# Patient Record
Sex: Female | Born: 1960 | Race: White | Hispanic: No | Marital: Married
Health system: Midwestern US, Academic
[De-identification: ages and names within clinical notes are randomized; demographics above are authoritative.]

---

## 2019-04-01 IMAGING — MG MAMMOGRAM, DIGITAL SCREEN BILA
1 series · 4 of 4 positions shown · non-contrast
Comparison: none

[Series 2: R CC · right · 4 of 4 slices shown]
[im 1/4]
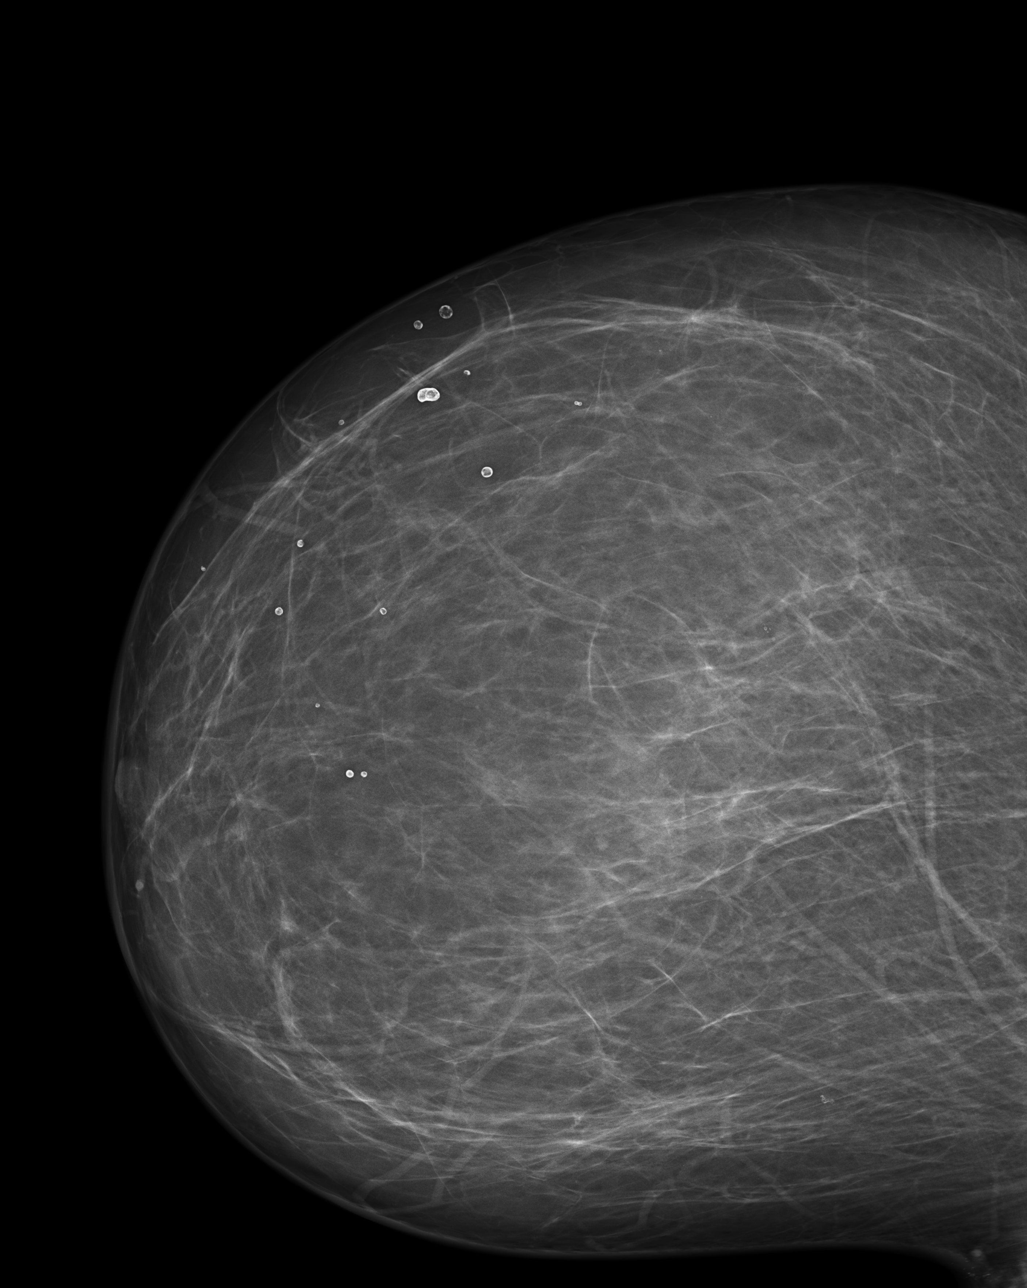
[im 2/4]
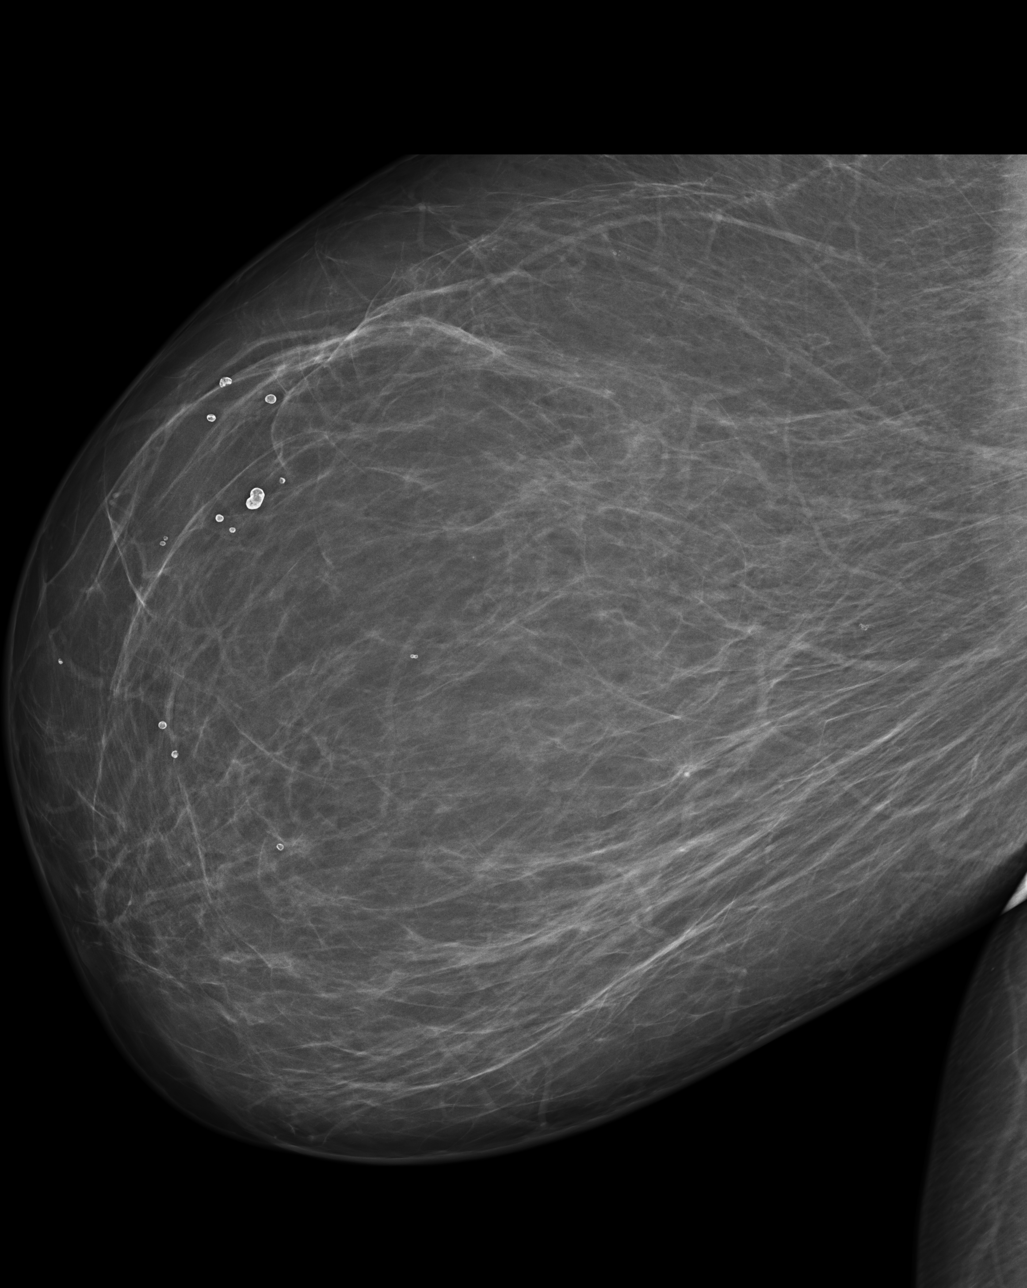
[im 3/4]
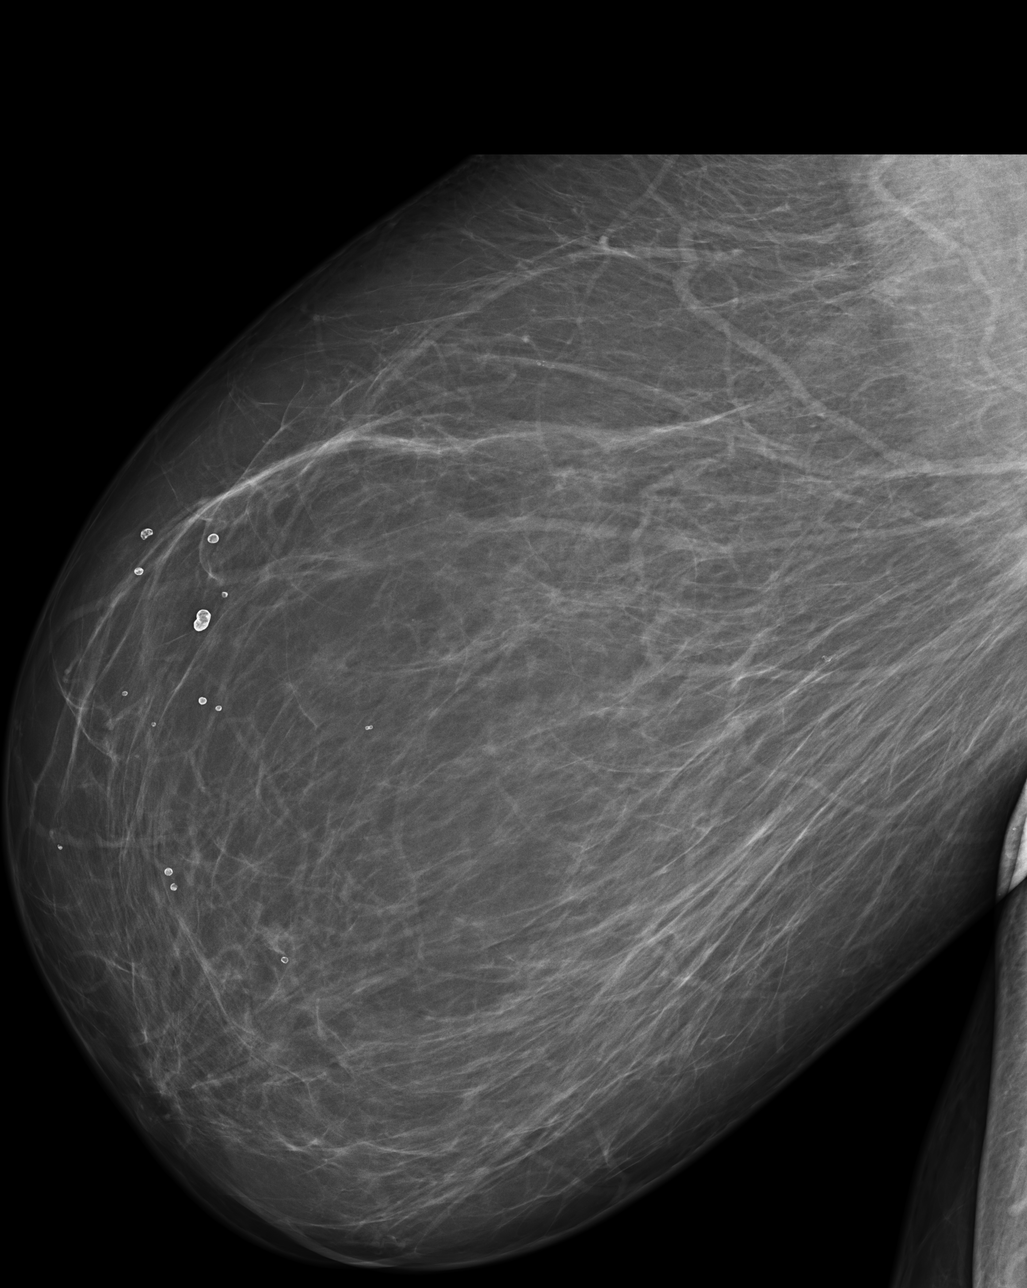
[im 4/4]
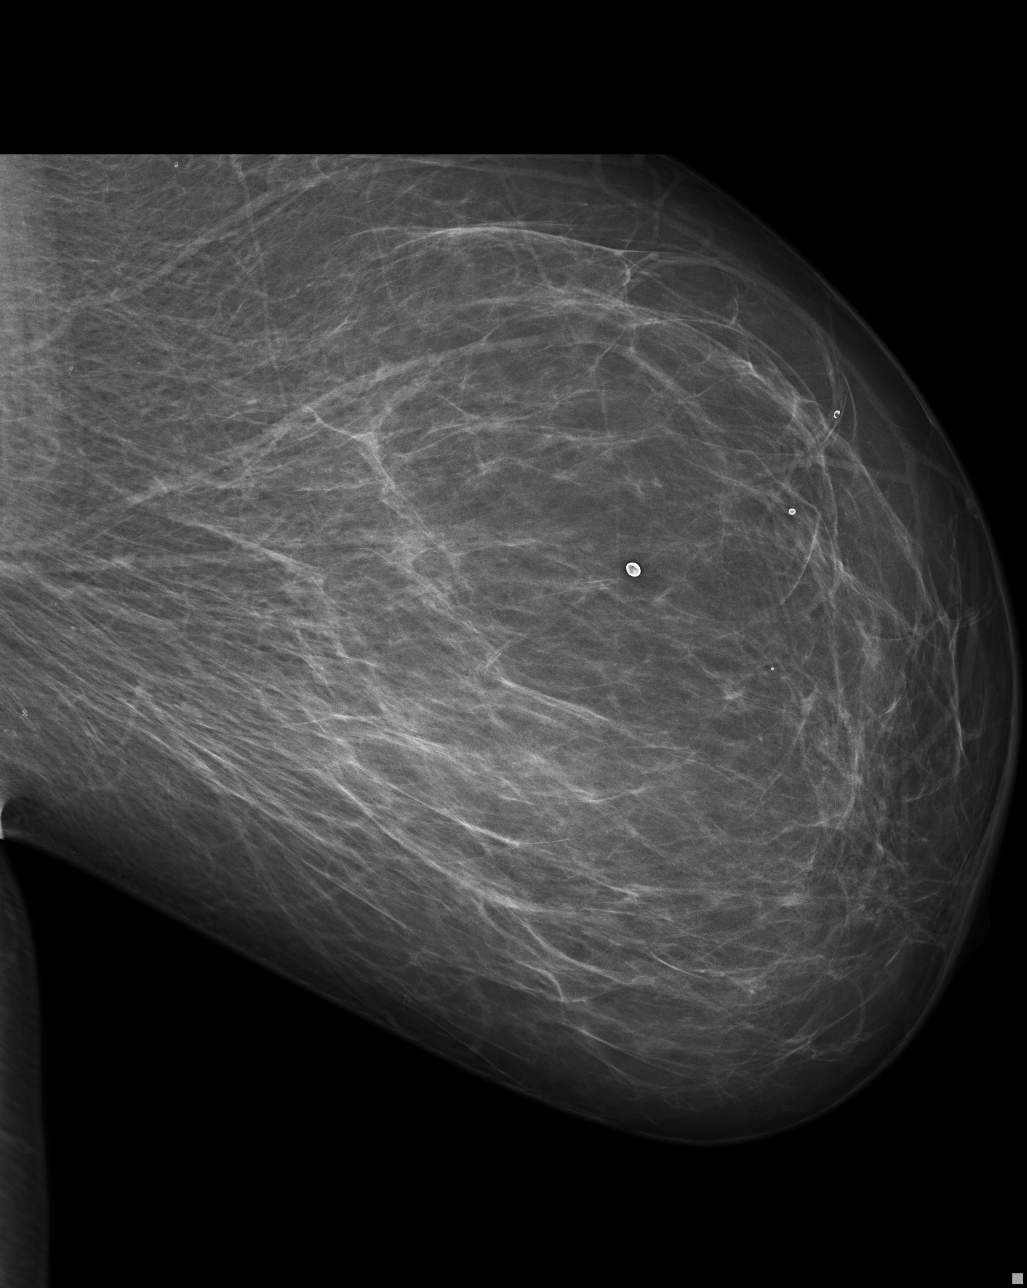

[4 of 4 positions shown; findings below may reference images not displayed]

EXAM
SCREENING MAMMOGRAM, BILATERAL

INDICATION
screening
BASELINE MAMMO TODAY. SCREENING. AB (2D) PRIORS: NONE.

TECHNIQUE
Bilateral craniocaudal and medial lateral oblique views were generated and reviewed with computer-
aided detection.

COMPARISONS
Baseline exam.

FINDINGS
ACR Type 2: 25-50% There are scattered fibroglandular densities.
No concerning masses or calcifications are seen.

IMPRESSION
No mammographic evidence malignancy. One year follow-up is recommended.
BI-RADS 2, BENIGN.

Tech Notes:

## 2020-01-15 IMAGING — CT BRAIN WO(Adult)
3 of 4 series · 14 of 47 positions shown, 16 images · non-contrast
Comparison: none

[Series 4: brain cor 5.00 hr40 s3 · coronal · 0.30mm/px · 3 of 41 slices shown]
[im 14/41  brain]
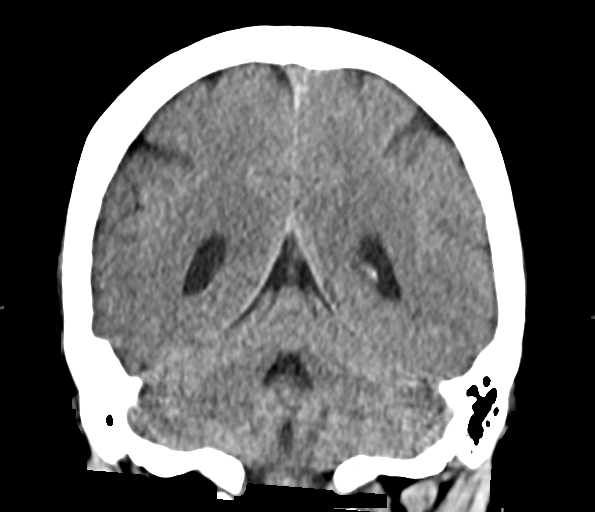
[im 18/41  brain]
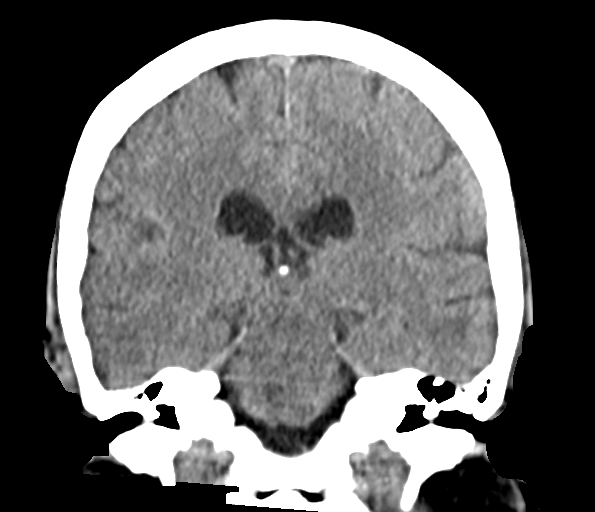
[im 23/41  brain]
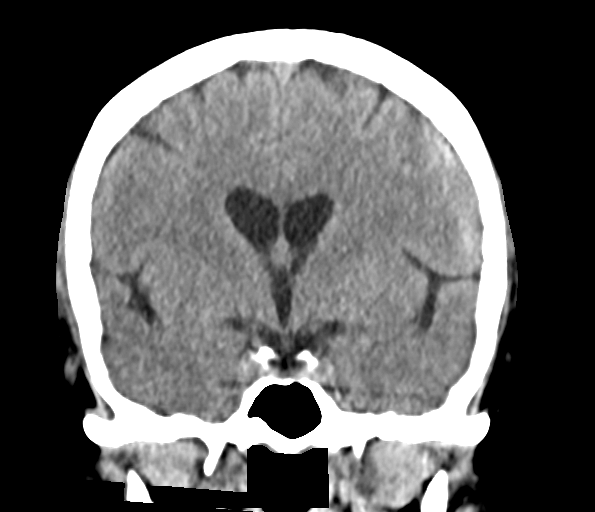

[Series 6: brain sag 5.00 hr40 s3 · sagittal · 0.30mm/px · 3 of 35 slices shown]
[im 12/35  brain]
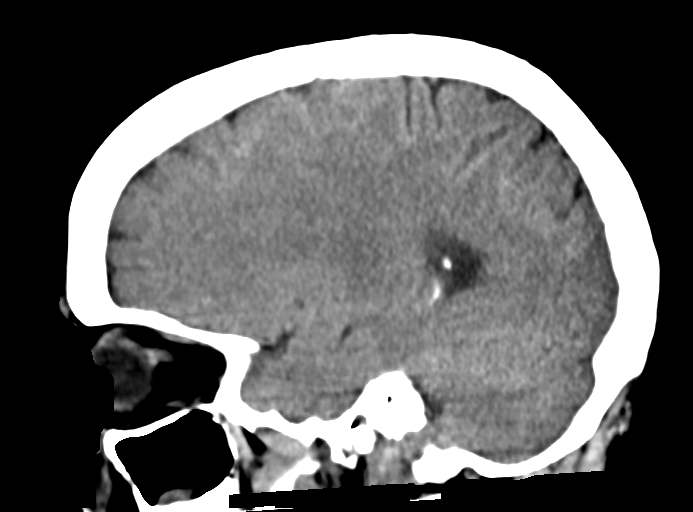
[im 18/35  brain]
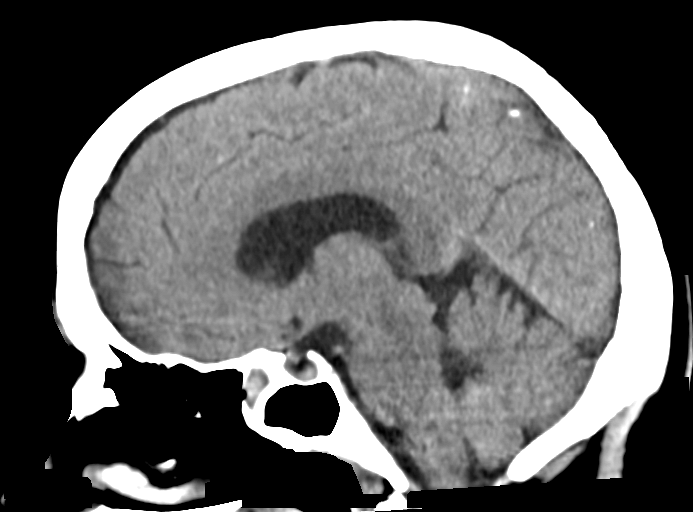
[im 23/35  brain]
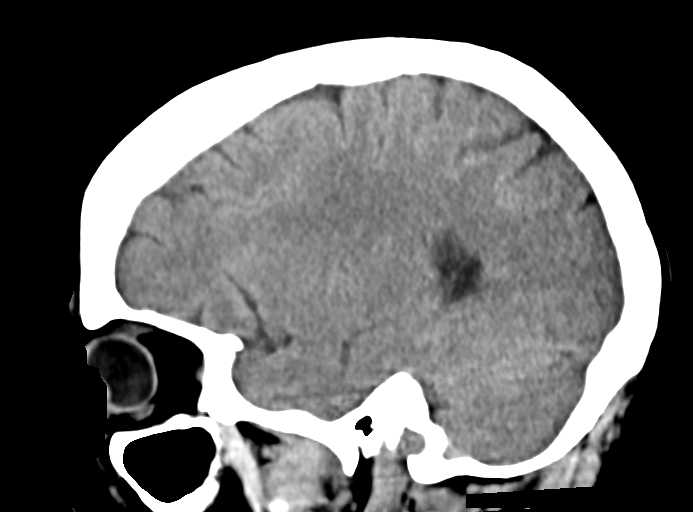

[Series 8: brain ax 2.00 hr60 s3 · axial · 0.35mm/px · z∈[-520,-401]mm · 8 of 76 slices shown, 10 images]
[im 8/76  brain]
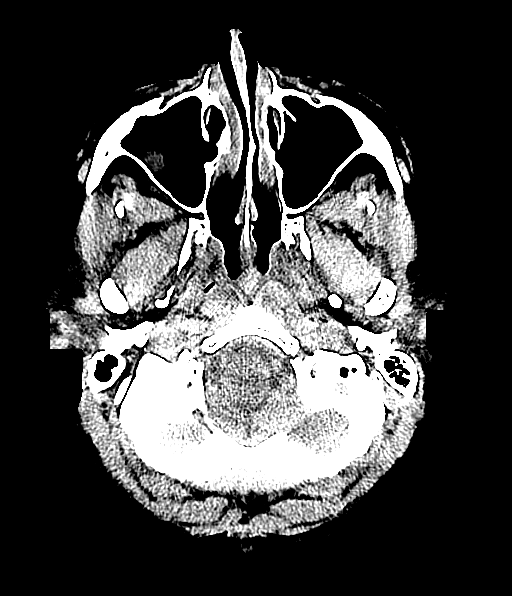
[im 8/76  bone]
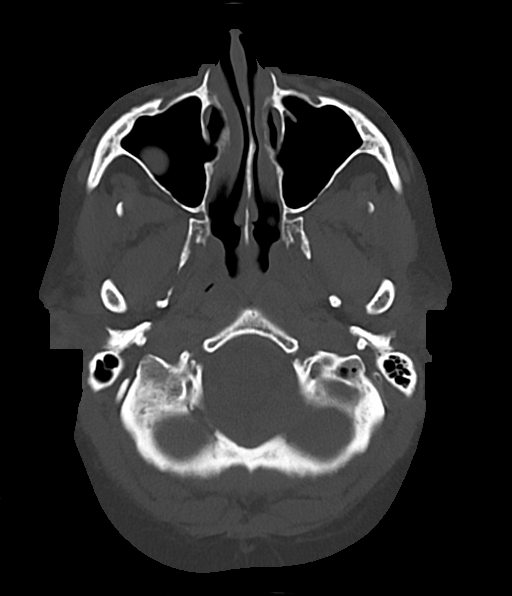
[im 15/76  brain]
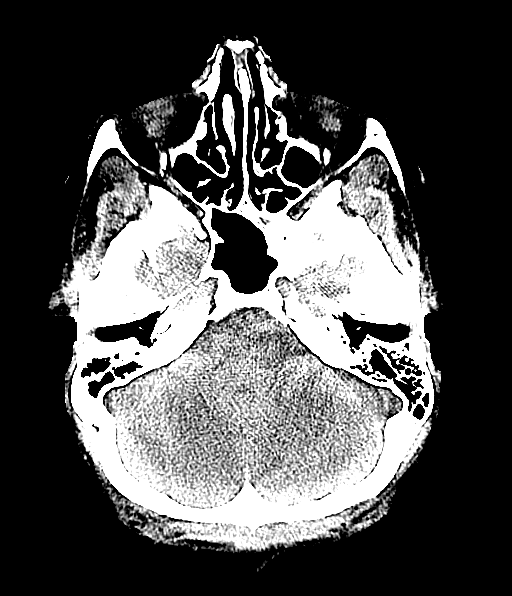
[im 26/76  brain]
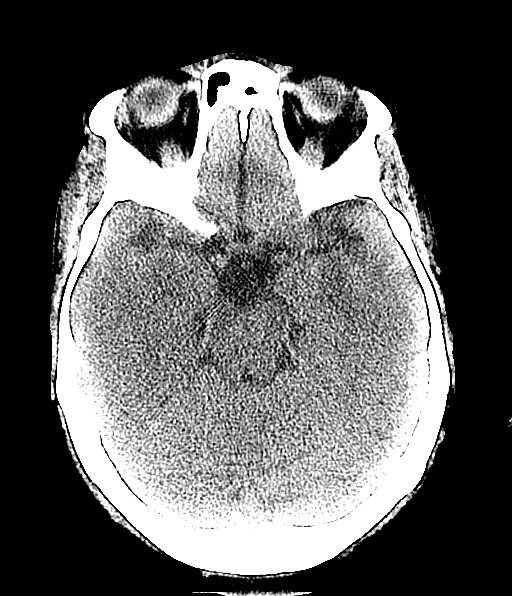
[im 33/76  brain]
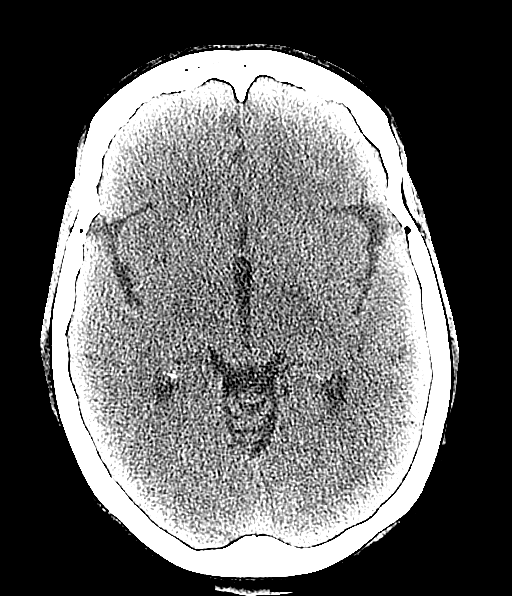
[im 43/76  brain]
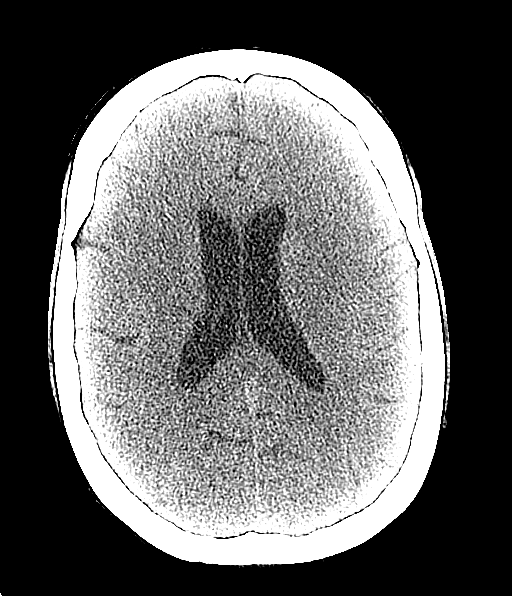
[im 43/76  bone]
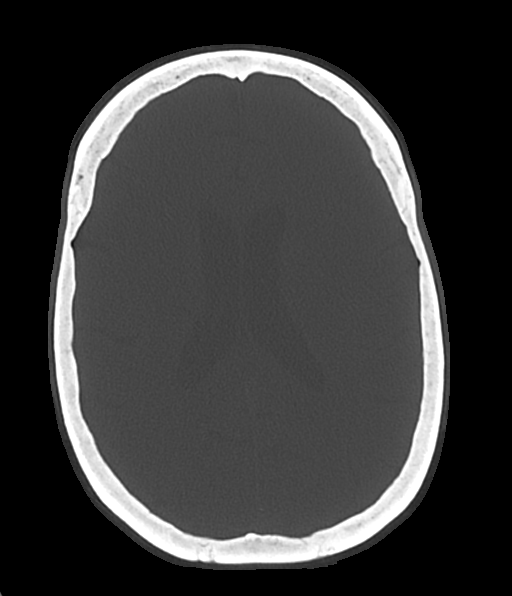
[im 51/76  brain]
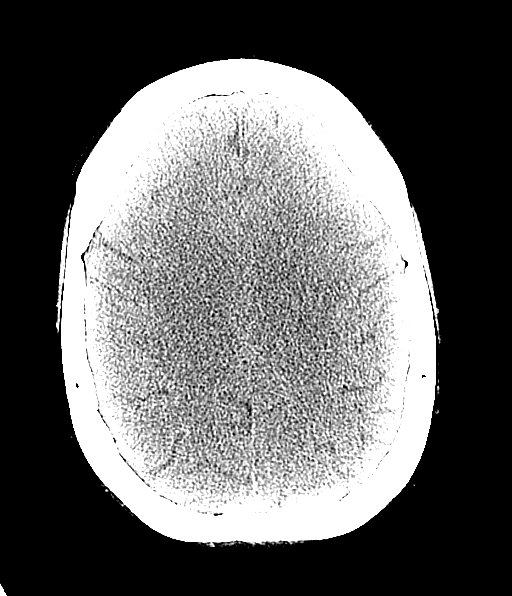
[im 61/76  brain]
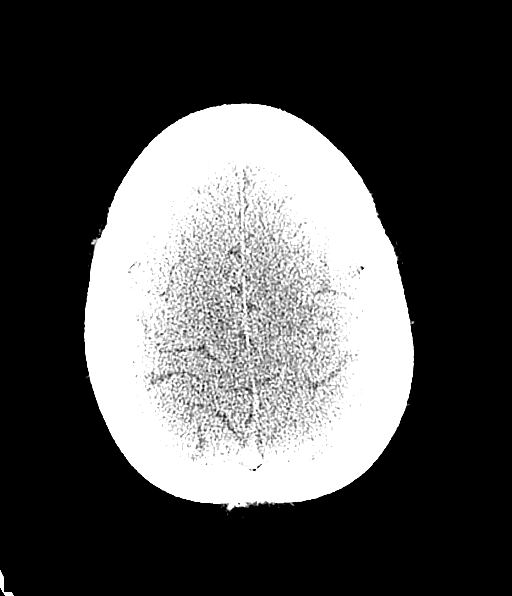
[im 68/76  brain]
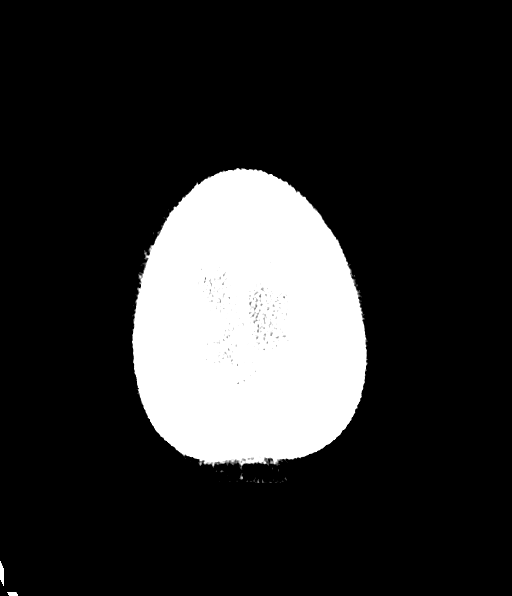

[14 of 47 positions shown; findings below may reference images not displayed]

DIAGNOSTIC STUDIES

EXAM

CT head/brain wo con

INDICATION

increasing weakness, can't walk
Patient reports weakness and difficulty walking since starting new medication yesterday.  BG

TECHNIQUE

All CT scans at this facility use dose modulation, iterative reconstruction, and/or weight based
dosing when appropriate to reduce radiation dose to as low as reasonably achievable.

Number of previous computed tomography exams in the last 12 months is 0  .

Number of previous nuclear medicine myocardial perfusion studies in the last 12 months is 0  .

COMPARISONS

None available.

FINDINGS

No intracranial hemorrhage. No mass effect or cerebral herniation. The ventricles are normal in
size and configuration. Calvarium is intact. Right maxillary sinus polyp or mucosal retention cyst.
Mastoid air cells are clear.

IMPRESSION

No acute intracranial findings.

Tech Notes:

Patient reports weakness and difficulty walking since starting new medication yesterday.
BG

## 2020-01-16 IMAGING — MR T-spine^Routine
7 of 8 series · 41 of 48 positions shown · non-contrast
Comparison: none

[Series 3: T2 · sagittal · 3.0mm · 0.62mm/px · 5 of 15 slices shown (1 of 3)]
[im 1/15]
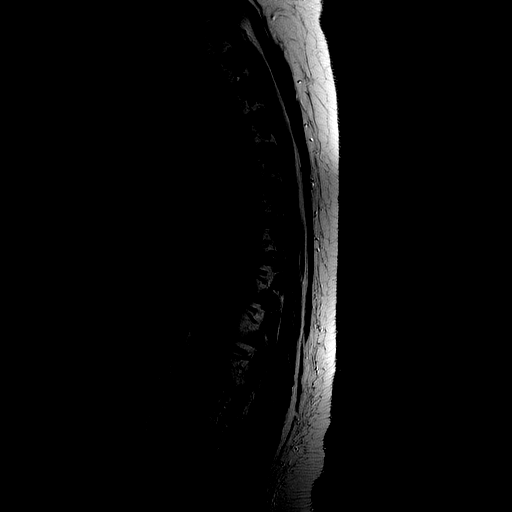
[im 4/15]
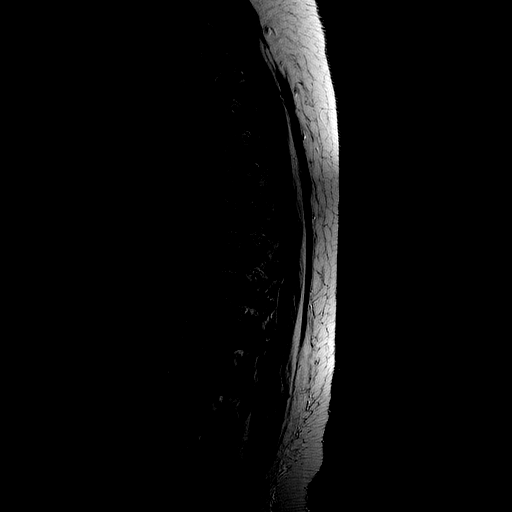
[im 8/15]
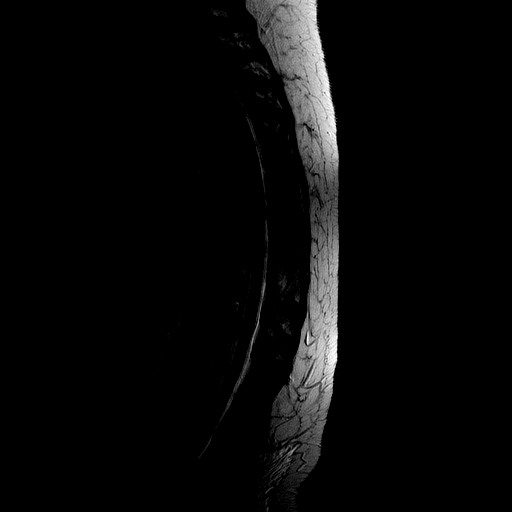
[im 11/15]
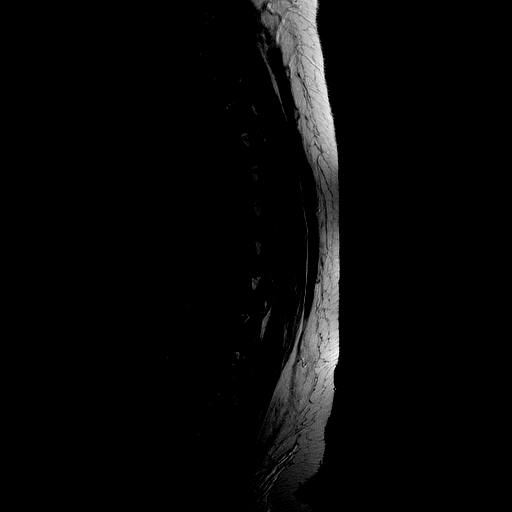
[im 15/15]
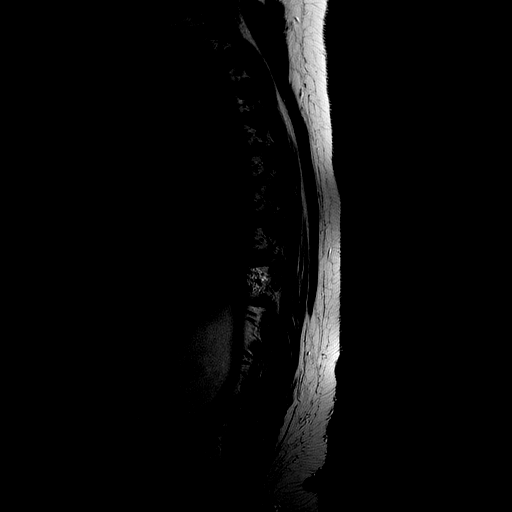

[Series 4: T1 · sagittal · 3.0mm · 0.71mm/px · 6 of 16 slices shown (1 of 3)]
[im 1/16]
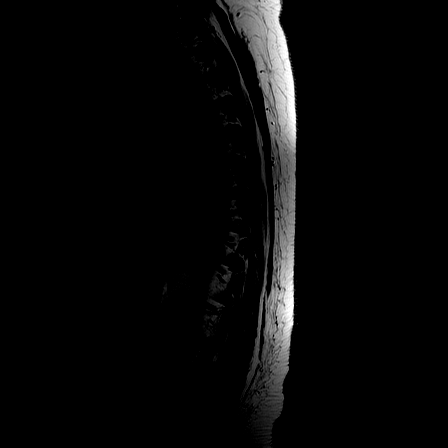
[im 4/16]
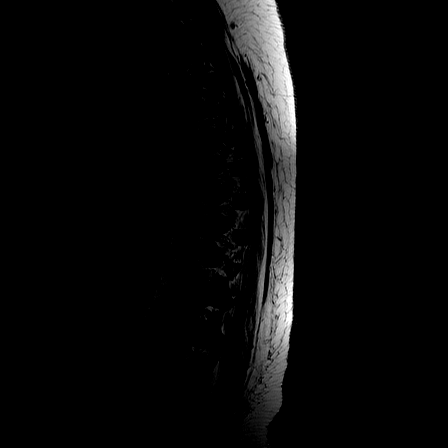
[im 7/16]
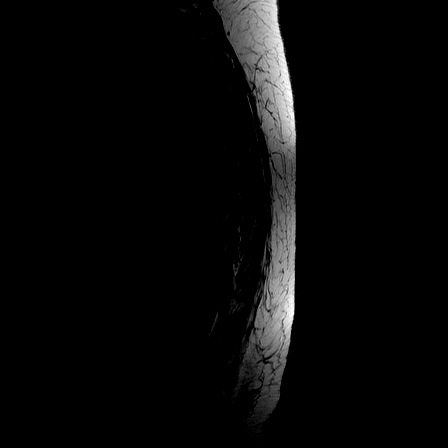
[im 10/16]
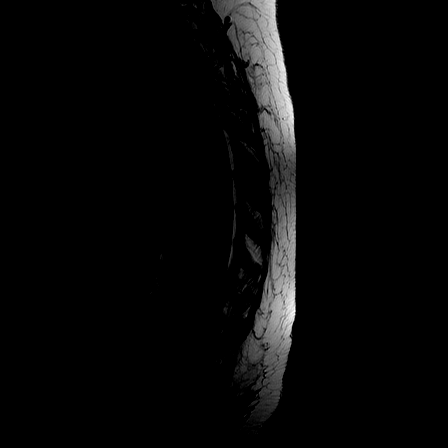
[im 13/16]
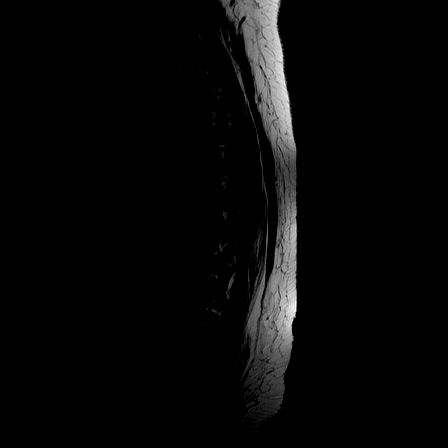
[im 16/16]
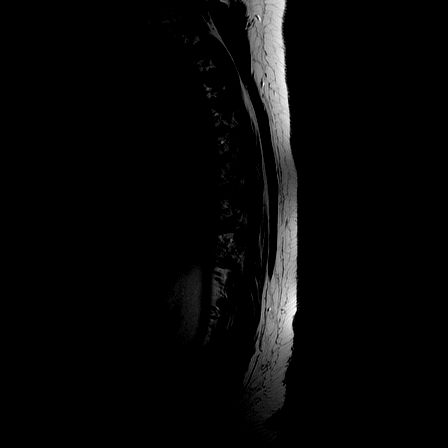

[Series 5: STIR · sagittal · 3.0mm · 0.62mm/px · 2 of 14 slices shown]
[im 1/14]
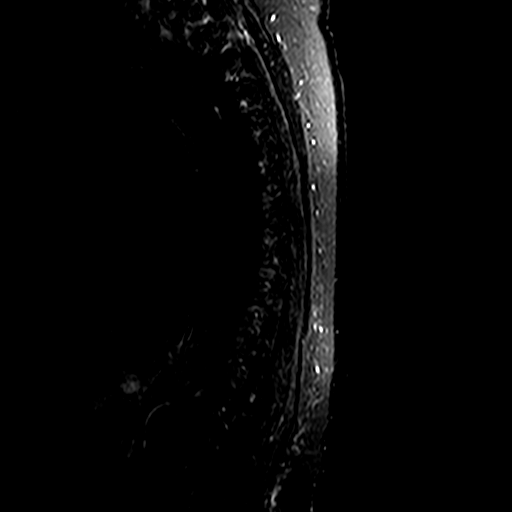
[im 4/14]
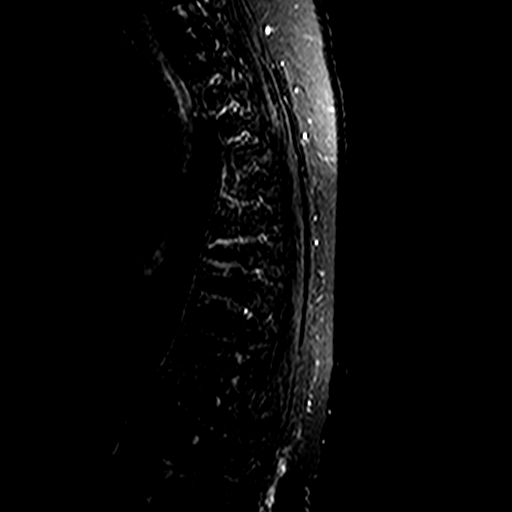

[Series 6: T2 · axial · 5.0mm · 0.62mm/px · z∈[-24,+114]mm · 7 of 20 slices shown (2 of 3)]
[im 1/20]
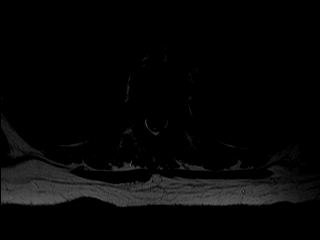
[im 4/20]
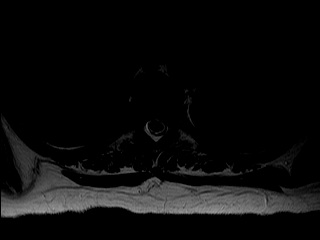
[im 7/20]
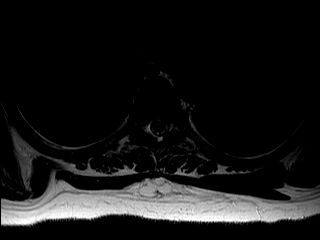
[im 10/20]
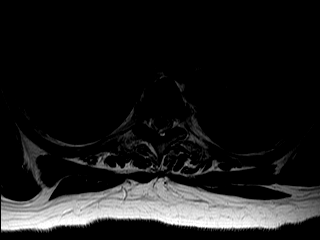
[im 13/20]
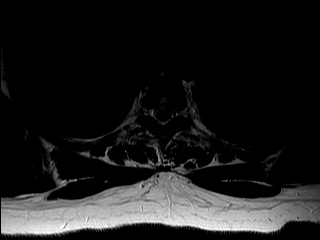
[im 16/20]
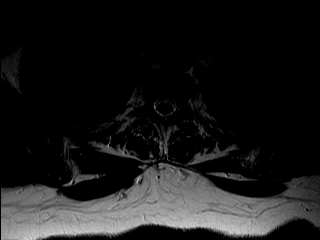
[im 20/20]
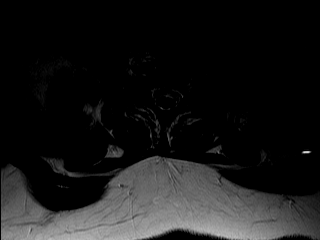

[Series 7: T1 · axial · 5.0mm · 0.78mm/px · z∈[-24,+114]mm · 7 of 19 slices shown (2 of 3)]
[im 1/19]
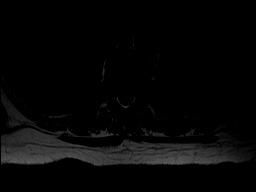
[im 4/19]
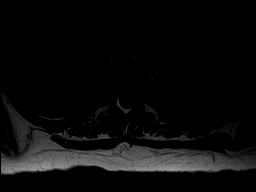
[im 7/19]
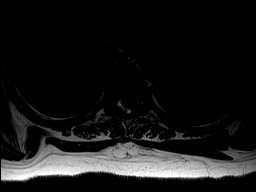
[im 10/19]
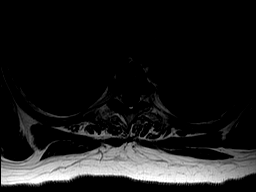
[im 13/19]
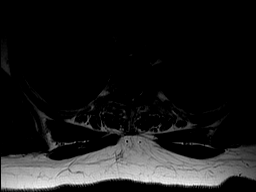
[im 16/19]
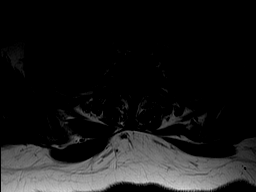
[im 19/19]
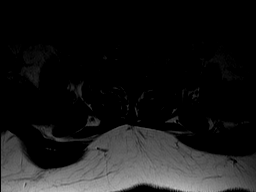

[Series 8: T2 · axial · 5.0mm · 0.62mm/px · z∈[-109,+29]mm · 7 of 20 slices shown (3 of 3)]
[im 1/20]
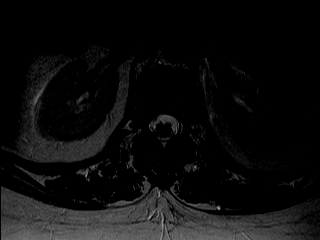
[im 4/20]
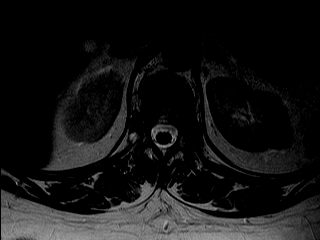
[im 7/20]
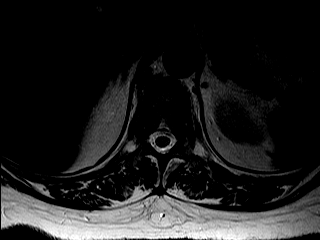
[im 10/20]
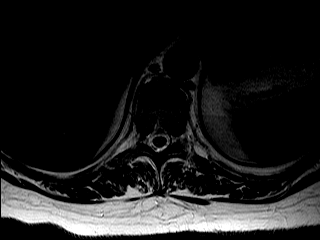
[im 13/20]
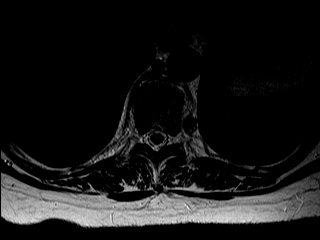
[im 16/20]
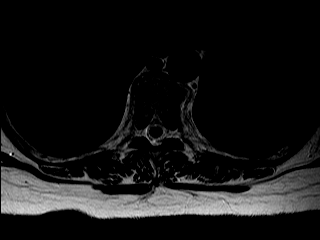
[im 20/20]
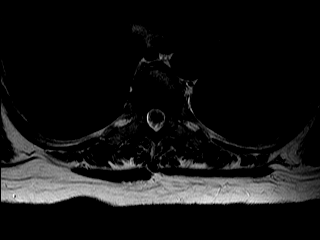

[Series 9: T1 · axial · 5.0mm · 0.78mm/px · z∈[-109,+29]mm · 7 of 20 slices shown (3 of 3)]
[im 1/20]
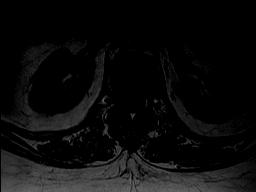
[im 4/20]
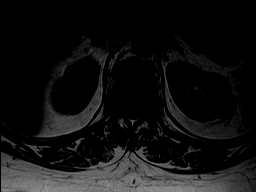
[im 7/20]
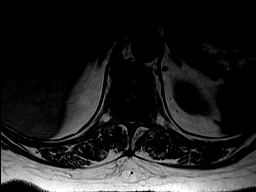
[im 10/20]
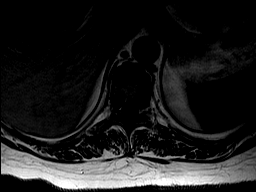
[im 13/20]
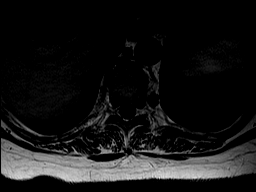
[im 16/20]
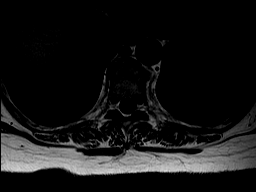
[im 20/20]
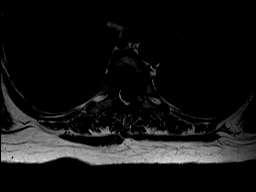

[41 of 48 positions shown; findings below may reference images not displayed]

EXAM

MR thoracic spine

INDICATION

left leg numb, both legs weak and stiff
History of parkinsons, weakness of lower extremity with inability to move.  New onset last few days
after starting new medications.

FINDINGS

There is no wedging or compression. There is no subluxation. No marrow edema is identified.

There is diffuse mid and lower thoracic degenerative changes with disc bulging. No dominant
herniation is identified. There is no canal or neural foraminal stenosis

IMPRESSION

There is no compression or subluxation

There diffuse degenerative changes in the mid and lower thoracic spine without stenosis of the
canal or neural foramen.

Tech Notes:

History of parkinsons, weakness of lower extremity with inability to move.  New onset last few days
after starting new medications.

## 2020-01-16 IMAGING — MR L-spine^LUMBAR BLOCK
5 series · 34 of 48 positions shown · non-contrast
Comparison: none

[Series 2: T2 · sagittal · 4.0mm · 0.62mm/px · 6 of 13 slices shown (1 of 2)]
[im 1/13]
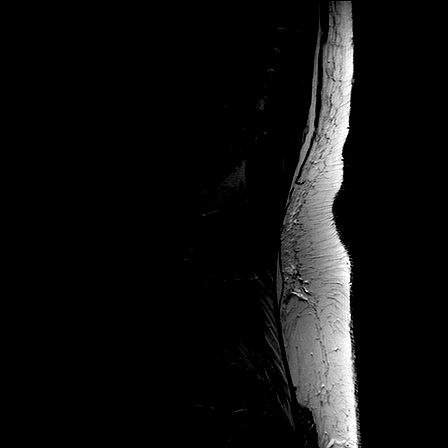
[im 3/13]
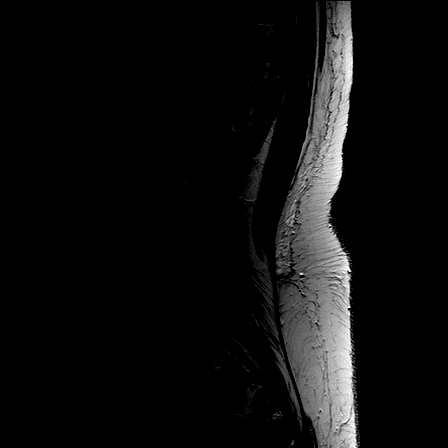
[im 5/13]
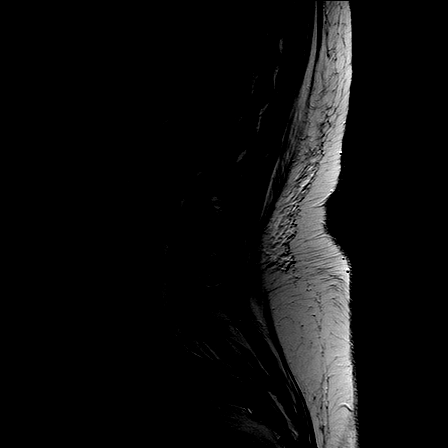
[im 8/13]
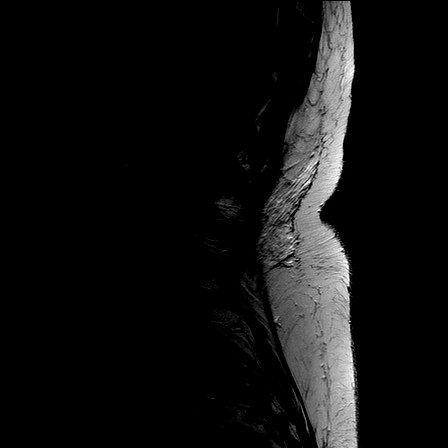
[im 10/13]
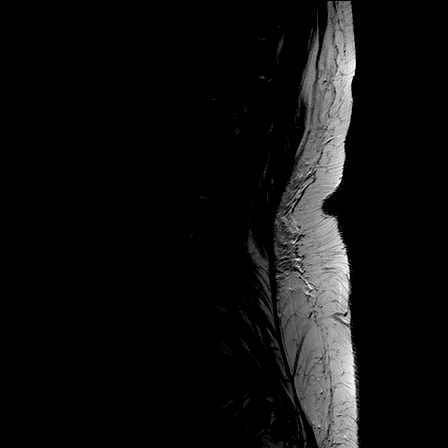
[im 13/13]
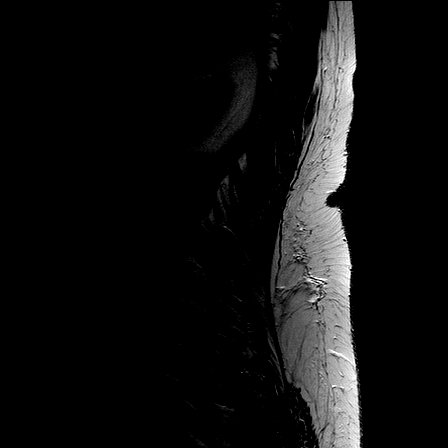

[Series 3: T1 · sagittal · 4.0mm · 0.73mm/px · 6 of 13 slices shown (1 of 2)]
[im 1/13]
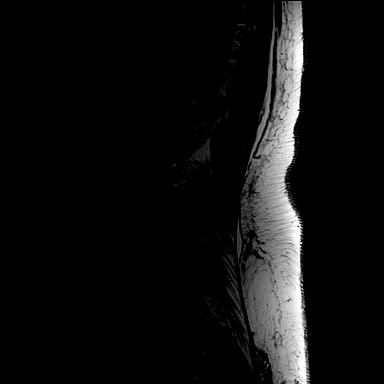
[im 3/13]
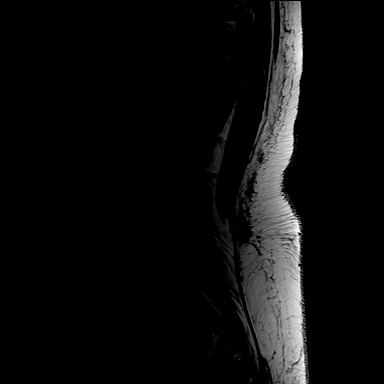
[im 5/13]
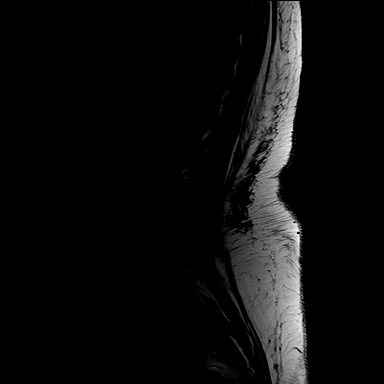
[im 8/13]
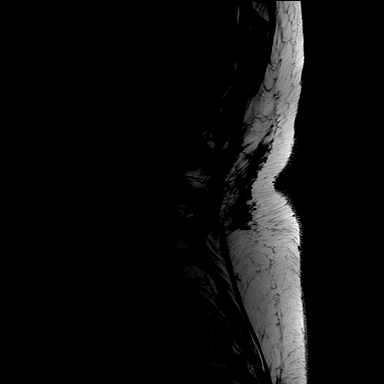
[im 10/13]
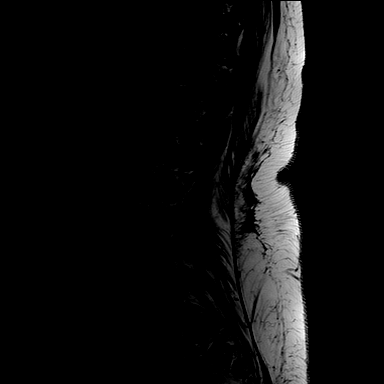
[im 13/13]
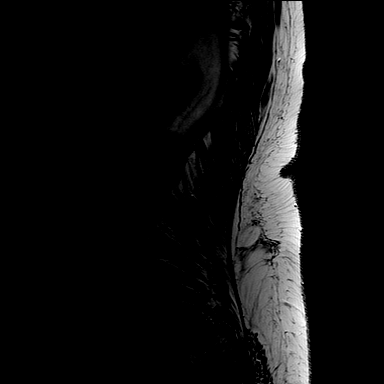

[Series 4: STIR · sagittal · 4.0mm · 0.55mm/px · 4 of 12 slices shown]
[im 1/12]
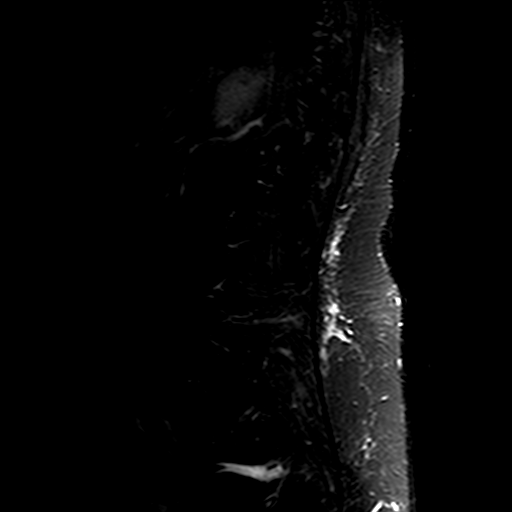
[im 3/12]
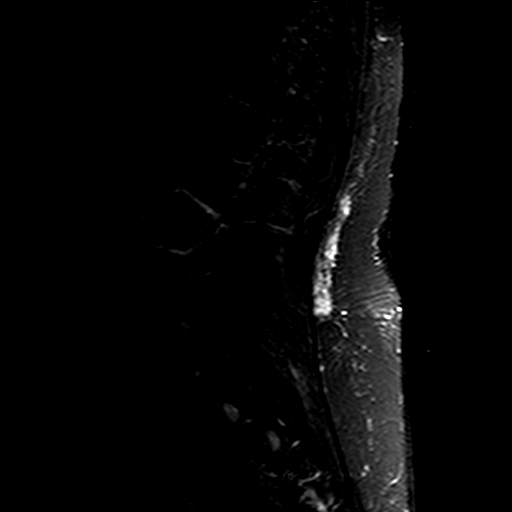
[im 5/12]
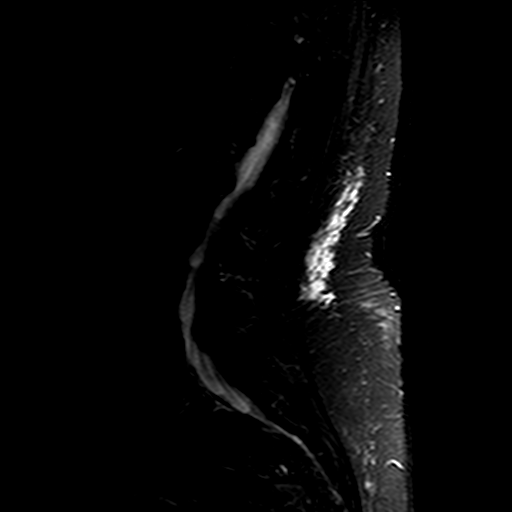
[im 7/12]
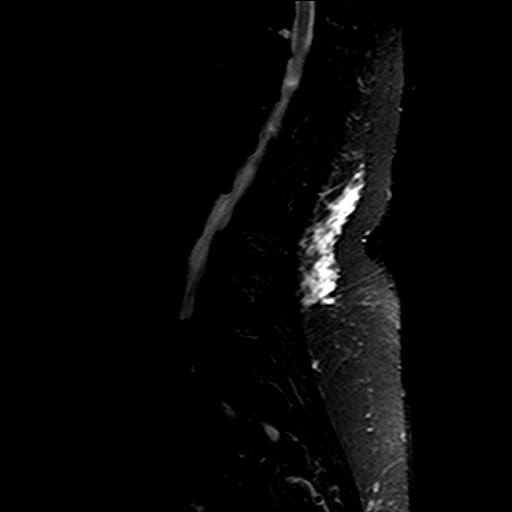

[Series 5: T2 · oblique · 4.5mm · 0.49mm/px · 9 of 31 slices shown (2 of 2)]
[im 1/31]
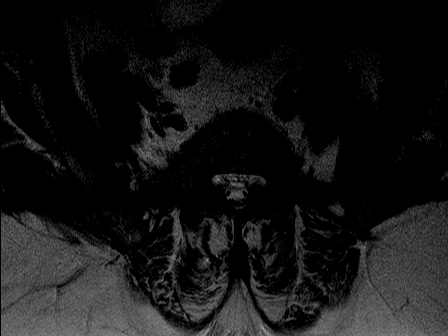
[im 5/31]
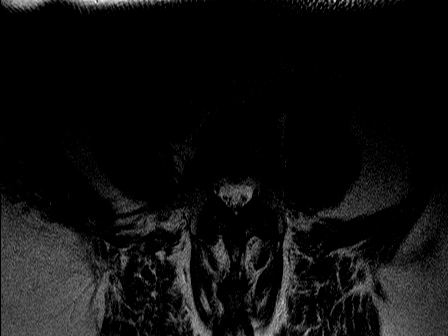
[im 9/31]
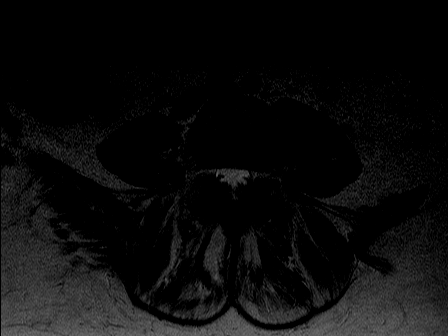
[im 13/31]
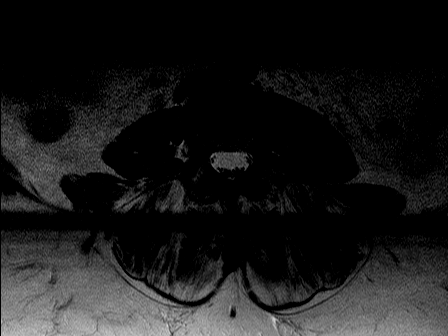
[im 16/31]
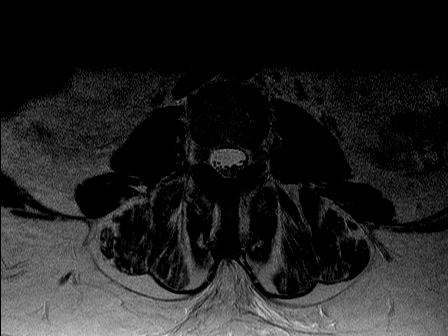
[im 18/31]
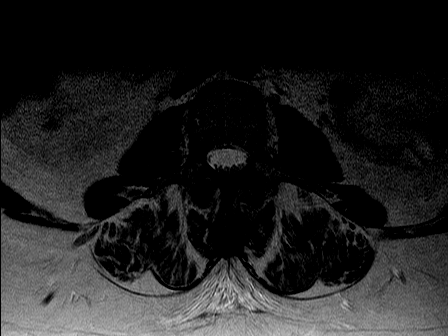
[im 22/31]
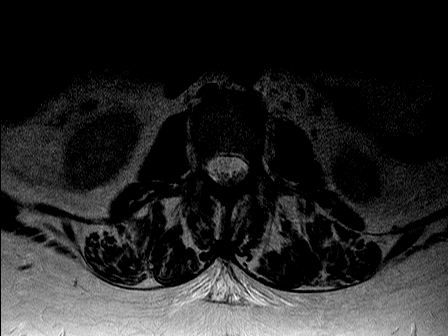
[im 26/31]
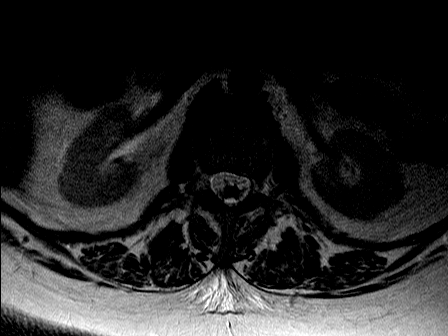
[im 31/31]
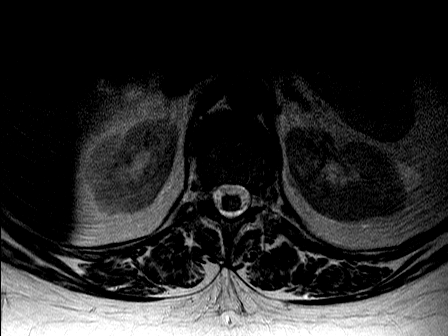

[Series 6: T1 · oblique · 4.5mm · 0.86mm/px · 9 of 31 slices shown (2 of 2)]
[im 1/31]
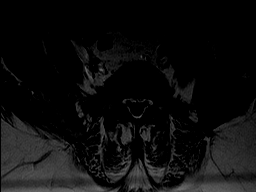
[im 5/31]
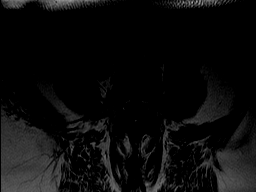
[im 9/31]
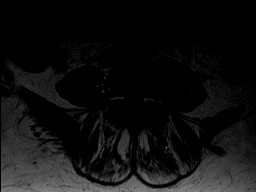
[im 13/31]
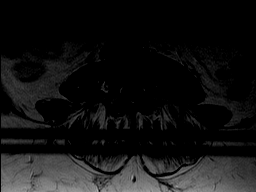
[im 16/31]
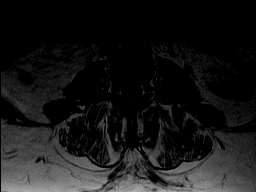
[im 18/31]
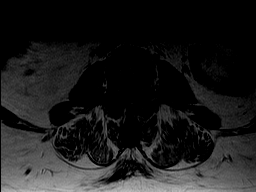
[im 22/31]
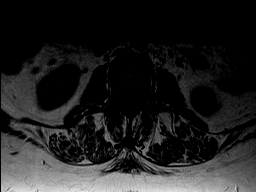
[im 26/31]
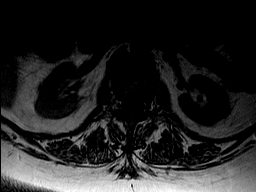
[im 31/31]
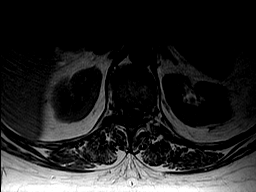

[34 of 48 positions shown; findings below may reference images not displayed]

EXAM

MR lumbar spine.

INDICATION

left leg feels numb, has parkinson's
History of parkinsons, weakness of lower extremity with inability to move.  New onset last few days
after starting new medications.

FINDINGS

There is no wedging or compression. There is no subluxation. No marrow edema is identified. The
conus ends at L1.

There is mild bulging of the L1-2 disc. There is no herniation or significant stenosis.

At L2-3 there is subtle disc bulging. There is facet hypertrophy. There is no herniation. There is
no stenosis.

The L3-4 disc is maintained. There is facet and ligamentum flavum hypertrophy without significant
stenosis.

The L4-5 disc is maintained. There is facet and ligamentum flavum hypertrophy. There is no
significant stenosis.

The L5-S1 disc is maintained. There is facet ligamentum flavum hypertrophy. There is no herniation
or stenosis.

IMPRESSION

There is no compression or subluxation

There are mild degenerative changes. There is no dominant herniation or significant stenosis
identified.

Tech Notes:

History of parkinsons, weakness of lower extremity with inability to move.  New onset last few days
after starting new medications.

## 2020-01-16 IMAGING — MR C-spine^Routine
5 series · 43 of 48 positions shown · non-contrast
Comparison: none

[Series 2: T2 · sagittal · 3.0mm · 0.54mm/px · 7 of 12 slices shown]
[im 1/12]
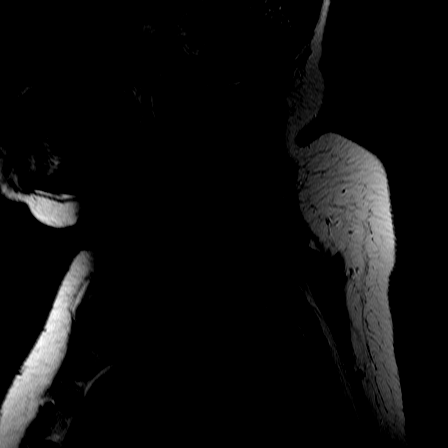
[im 2/12]
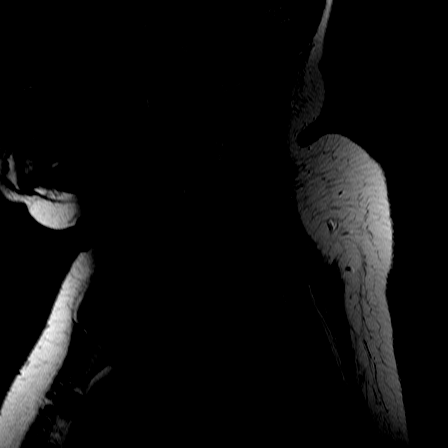
[im 4/12]
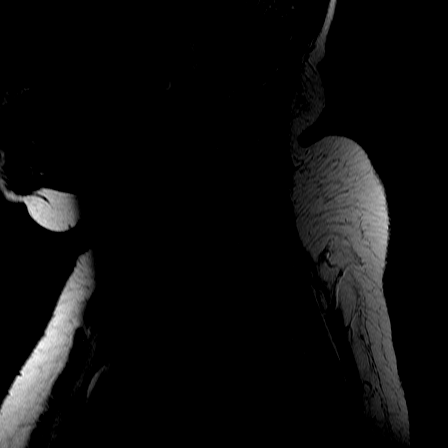
[im 6/12]
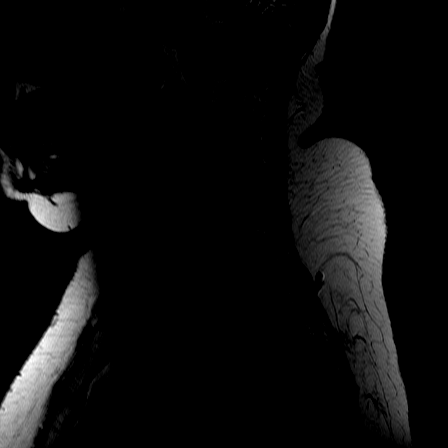
[im 8/12]
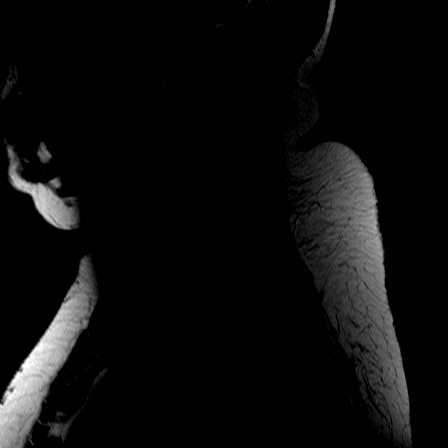
[im 10/12]
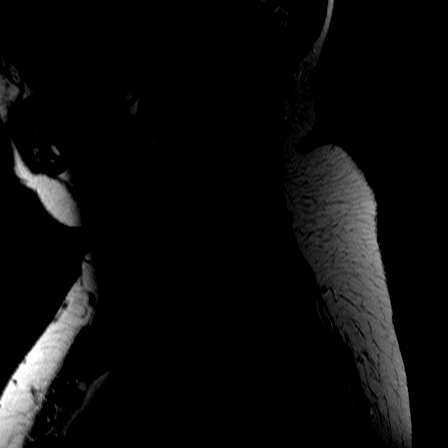
[im 12/12]
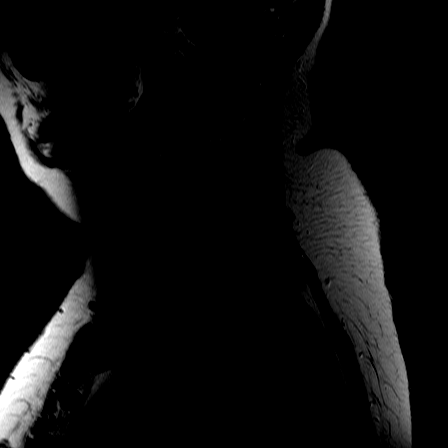

[Series 3: T1 · sagittal · 3.0mm · 0.75mm/px · 6 of 11 slices shown (1 of 2)]
[im 1/11]
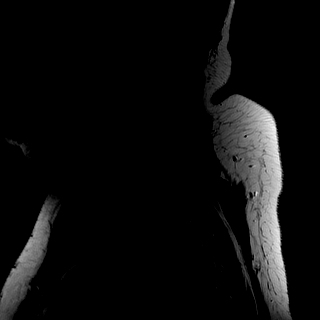
[im 3/11]
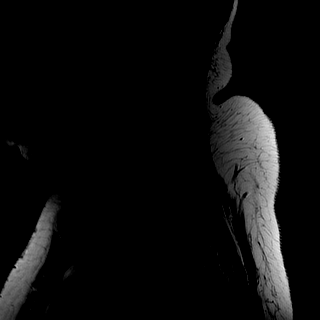
[im 5/11]
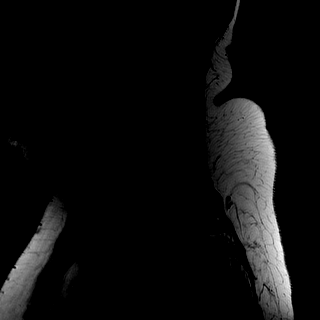
[im 7/11]
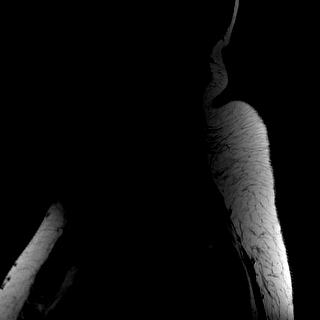
[im 9/11]
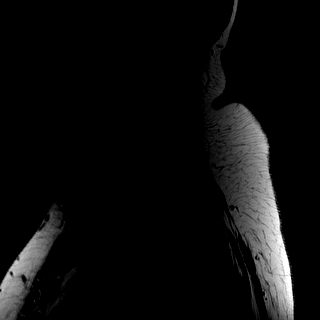
[im 11/11]
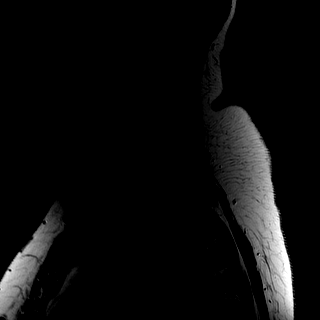

[Series 4: STIR · sagittal · 3.0mm · 0.94mm/px · 8 of 13 slices shown]
[im 1/13]
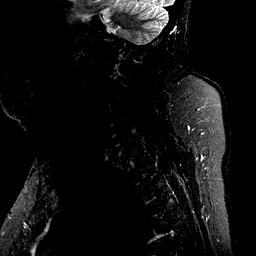
[im 2/13]
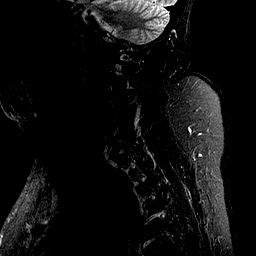
[im 4/13]
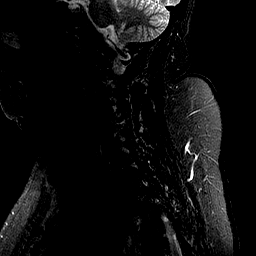
[im 6/13]
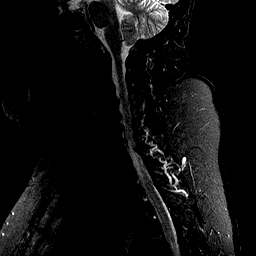
[im 7/13]
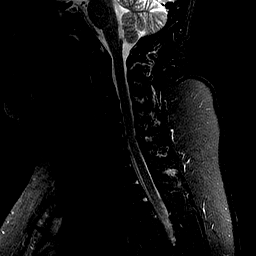
[im 9/13]
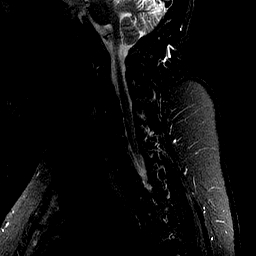
[im 11/13]
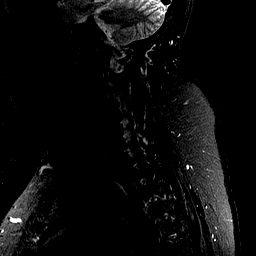
[im 13/13]
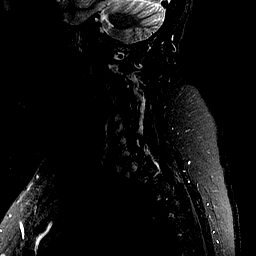

[Series 5: provider echo axial · axial · 3.0mm · 0.39mm/px · z∈[-61,+26]mm · 8 of 23 slices shown]
[im 1/23]
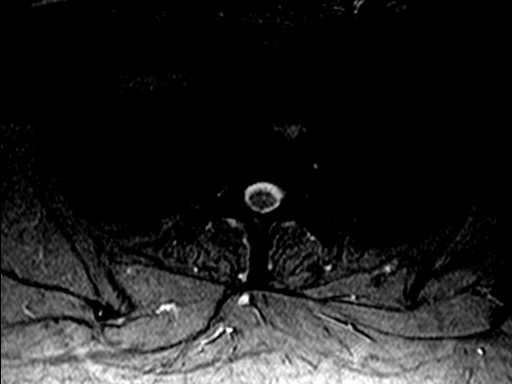
[im 4/23]
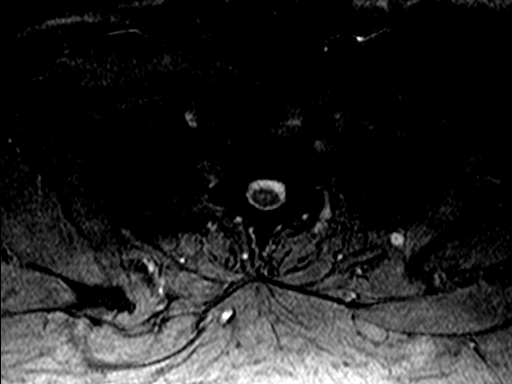
[im 8/23]
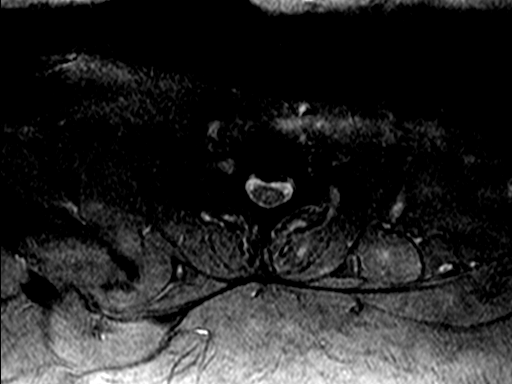
[im 10/23]
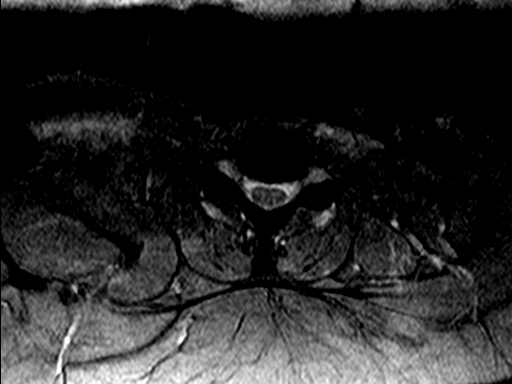
[im 13/23]
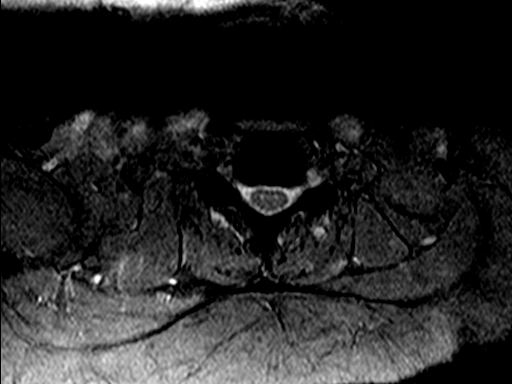
[im 15/23]
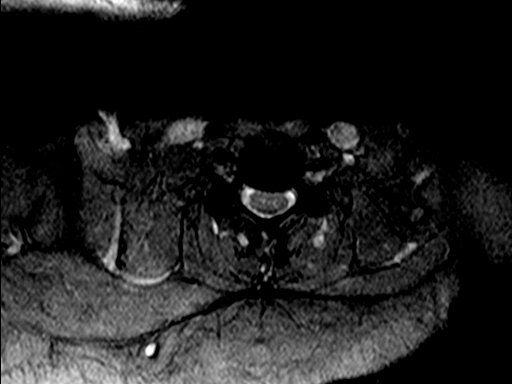
[im 19/23]
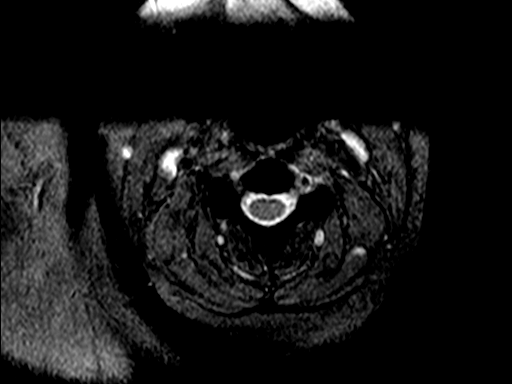
[im 23/23]
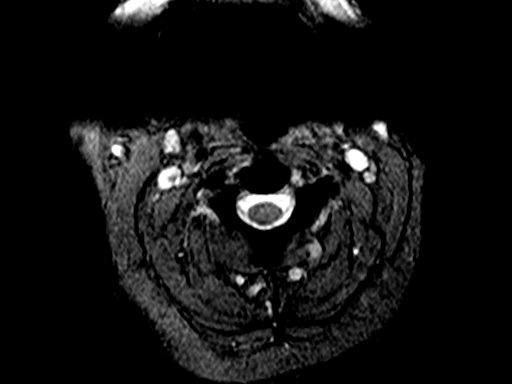

[Series 6: T1 · axial · 3.0mm · 0.78mm/px · z∈[-61,+26]mm · 14 of 24 slices shown (2 of 2)]
[im 1/24]
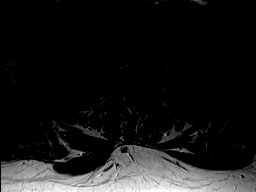
[im 2/24]
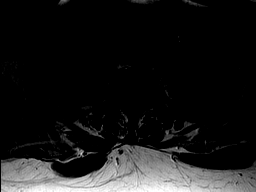
[im 4/24]
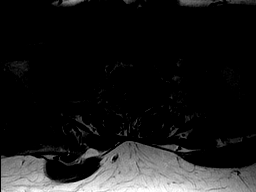
[im 6/24]
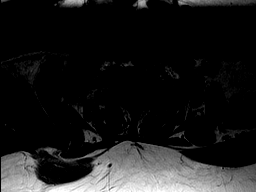
[im 8/24]
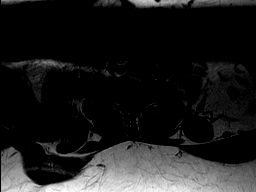
[im 9/24]
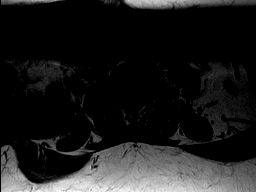
[im 11/24]
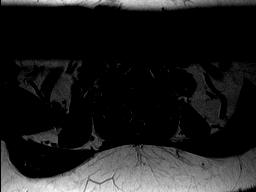
[im 13/24]
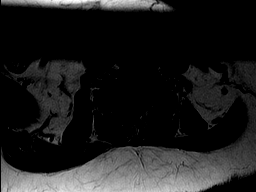
[im 15/24]
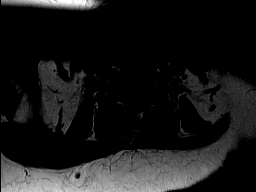
[im 16/24]
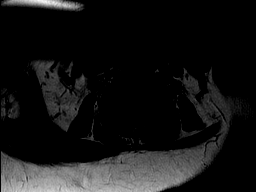
[im 18/24]
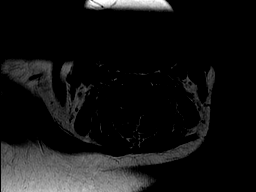
[im 20/24]
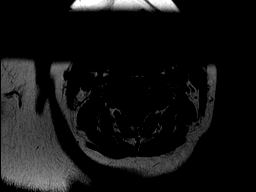
[im 22/24]
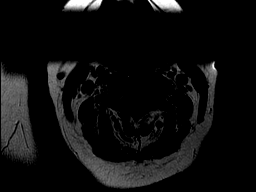
[im 24/24]
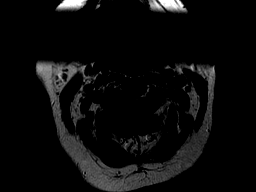

[43 of 48 positions shown; findings below may reference images not displayed]

EXAM

MR cervical spine.

INDICATION

left leg numbness without pain, parkinsons
History of parkinsons, weakness of lower extremity with inability to move.  New onset last few days
after starting new medications.

FINDINGS

There is no wedging or compression. There is no subluxation.
No marrow edema is identified. There is no tonsillar ectopia.

There is no herniation or stenosis at C2-3.

There is no herniation at C3-C4. There are endplate and right uncinate osteophytes without
significant stenosis.

The disc is narrowed at C4-5 with endplate osteophytes and uncinate osteophytes. There is minimal
canal narrowing and mild to greater left neural foraminal narrowing.

At C5-6 the disc space is narrowed. There are mild osteophytes with minimal canal and neural
foraminal narrowing.

At C6-7 there are midline osteophytes and limited uncinate osteophytes with mild canal and minimal
neural foraminal narrowing.

The C7-T1 disc is unremarkable.

IMPRESSION

There is no compression or subluxation. Mid and lower cervical spine degenerative changes are
present. Mild narrowing is noted as above.

Tech Notes:

History of parkinsons, weakness of lower extremity with inability to move.  New onset last few days
after starting new medications.

## 2020-05-10 ENCOUNTER — Inpatient Hospital Stay: Admit: 2020-05-10

## 2020-05-10 ENCOUNTER — Encounter: Admit: 2020-05-10 | Discharge: 2020-05-10

## 2020-05-10 ENCOUNTER — Inpatient Hospital Stay: Admit: 2020-05-10 | Discharge: 2020-05-10

## 2020-05-10 DIAGNOSIS — U071 Pneumonia due to COVID-19 virus: Secondary | ICD-10-CM

## 2020-05-10 MED ORDER — DEXAMETHASONE SODIUM PHOSPHATE 10 MG/ML IJ SOLN
6 mg | Freq: Every day | INTRAVENOUS | 0 refills | Status: CP
Start: 2020-05-10 — End: ?
  Administered 2020-05-11 – 2020-05-13 (×3): 6 mg via INTRAVENOUS

## 2020-05-10 MED ORDER — PIPERACILLIN/TAZOBACTAM 3.375 G/100ML NS IVPB (MB+)
3.375 g | INTRAVENOUS | 0 refills | Status: DC
Start: 2020-05-10 — End: 2020-05-11
  Administered 2020-05-11 (×6): 3.375 g via INTRAVENOUS

## 2020-05-10 MED ORDER — PANTOPRAZOLE 40 MG IV SOLR
40 mg | Freq: Every day | INTRAVENOUS | 0 refills | Status: DC
Start: 2020-05-10 — End: 2020-05-14
  Administered 2020-05-11 – 2020-05-13 (×4): 40 mg via INTRAVENOUS

## 2020-05-10 MED ORDER — CARBIDOPA-LEVODOPA 25-100 MG PO TBER
1.5 | Freq: Four times a day (QID) | ORAL | 0 refills | Status: DC
Start: 2020-05-10 — End: 2020-05-11
  Administered 2020-05-11: 13:00:00 1.5 via ORAL

## 2020-05-10 MED ORDER — ENOXAPARIN 100 MG/ML SC SYRG
1 mg/kg | Freq: Two times a day (BID) | SUBCUTANEOUS | 0 refills | Status: DC
Start: 2020-05-10 — End: 2020-05-16
  Administered 2020-05-11 – 2020-05-14 (×8): 90 mg via SUBCUTANEOUS

## 2020-05-10 MED ORDER — ALBUTEROL SULFATE 2.5 MG/0.5 ML IN NEBU
2.5 mg | RESPIRATORY_TRACT | 0 refills | Status: DC | PRN
Start: 2020-05-10 — End: 2020-05-11
  Administered 2020-05-11: 02:00:00 2.5 mg via RESPIRATORY_TRACT

## 2020-05-10 MED ORDER — LEVOTHYROXINE 75 MCG PO TAB
75 ug | Freq: Every day | ORAL | 0 refills | Status: DC
Start: 2020-05-10 — End: 2020-05-11
  Administered 2020-05-11: 13:00:00 75 ug via ORAL

## 2020-05-10 NOTE — H&P (View-Only)
Critical Care   Admission History and Physical Assessment       Name:  Stacey Deleon                                             MRN:  [id number]   Admission Date:  05/10/2020    Principal Problem:    Pneumonia due to COVID-19 virus  Active Problems:    Acute respiratory failure with hypoxia (HCC)    Pulmonary embolism (HCC)    Urinary tract infection    Hypothyroidism    Morbid obesity (HCC)    Parkinson's disease (HCC)                     Assessment and Plan     NEURO:   Severe Parkinson's   -pta Carbidopa-Levodopa 25-100mg  QID & Meloxicam 15mg  daily   -At baseline immobile in lower extremities with restricted movement in upper extremities   -AOx4 & reporting no pain on exam    Plan  -Continue Sinemet as prescribed  -Hold Meloxicam       CV:   HTN  -pta Hydrochlorothiazide 25mg  daily    -On arrival SBP 147/89, HR 68  -EKG revealing SR 55, PR .12, QTc 428  -BNP 166 & troponin 0.02 on arrival to [facility]   -Lactic acid 1.5   Plan  -Hold pta HCTZ  -2D echocardiogram ordered   -Telemetry monitoring   -Troponin x2     PULM:   Acute Hypoxic Respiratory Failure   COVID-19 Pneumonia   RLL Pulmonary Embolism   -Originally required a couple L NC at [facility] but progressively worked up to [facility] on 7/12  -CT Chest 7/12 at OSH revealing acute subsegmental PE within the right lower lobe. No evidence of right heart strain   -Placed on Apixaban after CT read and escalated to [facility]ip[facility]   -Transferred to [facility] on NRB 15L + 8LNC   -On arrival to [facility] ABG revealing 7.51 / 32 / 62  -NIPPV settings: 15/5 @ 100%   -CXR 7/13 revealing bilateral ground glass opacities consistent with COVID-19. Trace left pleural effusion   Plan  -Continue NIPPV, wean O2 as tolerated   -Dexamethasone 6mg  daily x3 more days (end date 7/16)  -Therapeutic Lovenox   -VBG QAM  -Albuterol PRN     GI:   Obesity  -BMI 40  -LFT's on 7/6 at OSH were: AST 50, ALT 17, Alk Phos 58  -LFT's now within normal limits    -BM pta  -Abd soft, obese, non tender on exam      RENAL:   Rhabdomyolysis (resolved)   -CK on 7/6 originally 1455, no other values reported in records  -CK 20 at [facility]   -Cr 0.71, BUN 27, AG 11, CO2 26  -No known renal dysfunction, presumed normal    Plan  -Electrolyte replacement PRN   -Maintain foley catheter   -I/O monitoring     ENDO:   Hypothyroidism   -pta Levothyroxine daily   -TSH 0.29 & T4 1.3  -Glucose 117  Plan  -Continue pta Synthroid   -A1c pending     ID:   COVID-19 Pneumonia   -COVID-19 PCR positive 7/9 at OSH    -Did not receive Remdesivir (outside window)   -Recieved 7/10 days of Dexamethasone at OSH  Inflammatory Markers:   -Ddimer 3492   -  CRP 25.7   -Ferritin 913    -Pt is not vaccinated   -Urine cultures at OSH + for E. coli  -WBC 10.7, afebrile   -CXR 7/13 showing multilobe ground glass opacities   -Blood cultures 7/13 pending   -UA 7/13 trace ketones, +2 blood, 2-10 WBC, neg leuks & nitrates    -Urine cultures 7/13 pending   -Procalcitonin 7/13 pending   -MRSA screen 7/13 pending   -Spneumo & legionella 7/13 pending    Plan  -Ordered Zosyn   -Follow culture data   -Dexamethasone as above   -Consider eval for Tocilizumab tomorrow on 7/14    HEME:   -Hgb 13.3, Hct 40.5, Plt 247  -INR 2.4, aPTT 28.9   -No evidence of bleeding   Plan  -Daily CBC     Prophylaxis Review:  Lines: Peripheral Line  Tubes: NA  Diet: No  Insulin: No  Urinary Catheter: Yes  VTE ppx: Lovenox, SCDs  GI ppx: PPI  PT/OT: Yes    Code status: Full Code   Disposition: Admit to the MICU     - Pt critically ill with the above diagnoses. I spent 90 minutes providing critical care services including:  ? reviewing outside records and obtaining history from the patient/family members  ? performing a physical examination  ? serially reviewing laboratory, telemetry, hemodynamic, oximetry, and respiratory data  ? reviewing radiographic images  ? reviewing medications  ? managing fluids/electrolytes, antibiotics, ICU prophylaxis, and mechanical ventilation   ? developing the overall plan of care    Maceo Pro, APRN   Pulmonary Critical Care  Pager [phone]  05/10/2020       M2 team pager (2nd call/nights) [phone]    ___________________________________________________________________________  Primary Care Physician: No primary care provider on file.      CHIEF COMPLAINT:  COVID-19 Pneumonia     HISTORY OF PRESENT ILLNESS: Stacey Deleon is a 59 y.o. female with a PMH of: Parkinsons, HTN, and hypothyroidism who initially began experiencing fatigue, weakness (pt is non ambulatory at baseline), lack of taste on 7/4. On 7/5 her husband assisted her in to bed and at some point during that day accidentally rolled on to the floor. Husband is currently ill himself and was unable to lift her up. He placed a small mattress underneath her were she remained for 1 day. On 7/6 her symptoms continued to progress and he contacted EMS. On their arrival, EMS reportedly found Ms. Stacey Deleon lying on the floor covered in urine with associated skin breakdown. She was transferred to [facility]. Upon arriving to [facility] both the patient and her husband were tested for COVID-19 and both were confirmed positive. She was admitted and was requiring minimal O2 requirements. On 7/12 her O2 requirements increased to 10LNC and a CT scan confirmed an acute subsegmental PE in the RLL. She was placed on Apixaban and NIPPV at that time. She was accepted in transfer to the ICU at [facility] for acute hypoxic respiratory failure 2/2 COVID-19 pneumonia, rhabdomyolysis, and a UTI.        PMH:  No past medical history on file.     PSH:  No past surgical history on file.     SOCIAL HISTORY:  Social History     Socioeconomic History   ? Marital status: Not on file     Spouse name: Not on file   ? Number of children: Not on file   ? Years of education: Not on file   ? Highest education level:  Not on file   Occupational History   ? Not on file   Tobacco Use   ? Smoking status: Not on file   Substance and Sexual Activity   ? Alcohol use: Not on file   ? Drug use: Not on file   ? Sexual activity: Not on file   Other Topics Concern   ? Not on file   Social History Narrative   ? Not on file        FAMILY HISTORY:  No family history on file.     IMMUNIZATIONS:   There is no immunization history on file for this patient.        ALLERGIES:  Patient has no allergy information on record.    HOME MEDICATIONS:  No medications prior to admission.        Vital Signs:  There were no vitals filed for this visit.    ROS:    Neg: chest pain, palpitations, headache, nausea, vomiting, diarrhea, pain, rash    Pos:  Dyspnea, fatigue, weakness      Physical Exam:    General:  Alert, cooperative, no distress, appears stated age  Head:  Normocephalic, without obvious abnormality, atraumatic  Eyes:  Conjunctivae/corneas clear  Mouth/throat: Moist mucous membranes  Neck:  Supple, symmetrical, trachea midline  Lungs:  Clear upper & diminished lower bilaterally; no wheeze  Heart:  Regular rate and rhythm, S1, S2 normal, no murmur appreciated  Abdomen:  Obese, soft, non-tender.  Bowel sounds normal  Extremities:  Extremities normal, atraumatic, no cyanosis or edema  Pulses:   2+ and symmetric, all extremities  Skin:   No rashes or lesions noted  Neurologic:  A&O4    Artificial Airway   None       Ventilator/Respiratory Therapy  No     Vent Weaning   Not applicable    Laboratory:  Recent Labs     05/10/20  2024   NA 145   K 4.3   CL 108   CO2 26   GAP 11   BUN 27*   CR 0.71   GLU 117*   CA 9.0   ALBUMIN 3.2*   MG 2.0   PO4 3.1   TSH 0.29*       Recent Labs     05/10/20  2008 05/10/20  2024   WBC 10.7  --    HGB 13.3  --    HCT 40.5  --    PLTCT 247  --    INR  --  2.4*   PTT  --  28.9   AST  --  10   ALT  --  <3*   ALKPHOS  --  66   TNI  --  0.02      CrCl cannot be calculated (No successful lab value found.).There were no vitals filed for this visit.   Recent Labs     05/10/20  2002   PHART 7.51*   PO2ART 62*           Radiology and Other Diagnostic Procedures Review: Reviewed

## 2020-05-11 ENCOUNTER — Inpatient Hospital Stay: Admit: 2020-05-11 | Discharge: 2020-05-11 | Payer: Medicaid Other

## 2020-05-11 LAB — CBC AND DIFF
Lab: 0 % (ref 0–2)
Lab: 0 % (ref 0–5)
Lab: 0.3 10*3/uL — ABNORMAL LOW (ref 1.0–4.8)
Lab: 0.4 K/UL (ref 0–0.80)
Lab: 10 10*3/uL (ref 4.5–11.0)
Lab: 10 FL (ref 7–11)
Lab: 13 g/dL (ref 12.0–15.0)
Lab: 14 % (ref 11–15)
Lab: 247 K/UL (ref 150–400)
Lab: 3 % — ABNORMAL LOW (ref 24–44)
Lab: 30 pg (ref 26–34)
Lab: 32 g/dL (ref 32.0–36.0)
Lab: 4 % (ref 4–12)
Lab: 4.3 M/UL (ref 4.0–5.0)
Lab: 40 % (ref 36–45)
Lab: 9.9 K/UL — ABNORMAL HIGH (ref 1.8–7.0)
Lab: 93 % — ABNORMAL HIGH (ref 41–77)
Lab: 93 FL (ref 80–100)

## 2020-05-11 LAB — PHOSPHORUS: Lab: 3.1 mg/dL — ABNORMAL LOW (ref 2.0–4.5)

## 2020-05-11 LAB — URINALYSIS DIPSTICK REFLEX TO CULTURE
Lab: NEGATIVE
Lab: NEGATIVE
Lab: NEGATIVE

## 2020-05-11 LAB — BLOOD GASES, ARTERIAL: Lab: 7.5 MMOL/L — ABNORMAL HIGH (ref 7.35–7.45)

## 2020-05-11 LAB — URINALYSIS MICROSCOPIC REFLEX TO CULTURE

## 2020-05-11 LAB — TROPONIN-I
Lab: 0 ng/mL (ref 0.0–0.05)
Lab: 0 ng/mL (ref 0.0–0.05)

## 2020-05-11 LAB — IONIZED CALCIUM: Lab: 1.2 MMOL/L — ABNORMAL LOW (ref 1.0–1.3)

## 2020-05-11 LAB — PTT (APTT): Lab: 28 s (ref 24.0–36.5)

## 2020-05-11 LAB — D-DIMER: Lab: 349 ng{FEU}/mL — ABNORMAL HIGH (ref <500)

## 2020-05-11 LAB — CREATINE KINASE-CPK: Lab: 20 U/L — ABNORMAL LOW (ref 21–215)

## 2020-05-11 LAB — COMPREHENSIVE METABOLIC PANEL
Lab: 10 U/L (ref 7–40)
Lab: 145 MMOL/L (ref 137–147)
Lab: 26 MMOL/L (ref 21–30)
Lab: 66 U/L (ref 25–110)

## 2020-05-11 LAB — PROTIME INR (PT): Lab: 2.4 — ABNORMAL HIGH (ref 0.8–1.2)

## 2020-05-11 LAB — FERRITIN: Lab: 913 ng/mL — ABNORMAL HIGH (ref 10–200)

## 2020-05-11 LAB — C REACTIVE PROTEIN (CRP): Lab: 25 mg/dL — ABNORMAL HIGH (ref <1.0)

## 2020-05-11 LAB — LACTIC ACID (BG - RAPID LACTATE): Lab: 1.5 MMOL/L — ABNORMAL LOW (ref 0.5–2.0)

## 2020-05-11 LAB — BNP (B-TYPE NATRIURETIC PEPTI): Lab: 166 pg/mL — ABNORMAL HIGH (ref 0–100)

## 2020-05-11 LAB — MAGNESIUM: Lab: 2 mg/dL — ABNORMAL HIGH (ref 1.6–2.6)

## 2020-05-11 LAB — FREE T4-FREE THYROXINE: Lab: 1.3 ng/dL (ref 0.6–1.6)

## 2020-05-11 LAB — TSH WITH FREE T4 REFLEX: Lab: 0.2 uU/mL — ABNORMAL LOW (ref 0.35–5.00)

## 2020-05-11 MED ORDER — CARBIDOPA-LEVODOPA 25-100 MG PO TAB
1.5 | Freq: Four times a day (QID) | ORAL | 0 refills | Status: DC
Start: 2020-05-11 — End: 2020-05-13
  Administered 2020-05-11 – 2020-05-13 (×8): 1.5 via ORAL

## 2020-05-11 MED ORDER — POLYETHYLENE GLYCOL 3350 17 GRAM PO PWPK
1 | Freq: Every day | ORAL | 0 refills | Status: DC
Start: 2020-05-11 — End: 2020-05-13

## 2020-05-11 MED ORDER — PERFLUTREN LIPID MICROSPHERES 1.1 MG/ML IV SUSP
1-20 mL | Freq: Once | INTRAVENOUS | 0 refills | Status: CP | PRN
Start: 2020-05-11 — End: ?
  Administered 2020-05-11: 14:00:00 3 mL via INTRAVENOUS

## 2020-05-11 MED ORDER — LEVOTHYROXINE 50 MCG PO TAB
50 ug | Freq: Every day | ORAL | 0 refills | Status: DC
Start: 2020-05-11 — End: 2020-05-14
  Administered 2020-05-12 – 2020-05-14 (×2): 50 ug via ORAL

## 2020-05-11 MED ORDER — TOCILIZUMAB IVPB (COVID-19)
800 mg | Freq: Once | INTRAVENOUS | 0 refills | Status: CP
Start: 2020-05-11 — End: ?
  Administered 2020-05-11 (×2): 800 mg via INTRAVENOUS

## 2020-05-11 MED ORDER — SENNOSIDES-DOCUSATE SODIUM 8.6-50 MG PO TAB
1 | Freq: Two times a day (BID) | ORAL | 0 refills | Status: DC
Start: 2020-05-11 — End: 2020-05-13
  Administered 2020-05-12 – 2020-05-13 (×2): 1 via ORAL

## 2020-05-11 MED ORDER — CEFTRIAXONE INJ 1GM IVP
1 g | INTRAVENOUS | 0 refills | Status: CP
Start: 2020-05-11 — End: ?
  Administered 2020-05-11 – 2020-05-14 (×4): 1 g via INTRAVENOUS

## 2020-05-11 MED ORDER — CARBIDOPA-LEVODOPA 25-100 MG PO TAB
2 | Freq: Four times a day (QID) | ORAL | 0 refills | Status: DC
Start: 2020-05-11 — End: 2020-05-11

## 2020-05-11 MED ORDER — ALBUTEROL SULFATE 90 MCG/ACTUATION IN HFAA
2 | RESPIRATORY_TRACT | 0 refills | Status: DC | PRN
Start: 2020-05-11 — End: 2020-05-16

## 2020-05-11 MED ORDER — SODIUM CHLORIDE 0.9 % TKO FLUID
INTRAVENOUS | 0 refills | Status: DC
Start: 2020-05-11 — End: 2020-05-16

## 2020-05-12 ENCOUNTER — Encounter: Admit: 2020-05-12 | Discharge: 2020-05-12

## 2020-05-12 MED ORDER — MELOXICAM 15 MG PO TAB
15 mg | Freq: Every day | ORAL | 0 refills | Status: DC
Start: 2020-05-12 — End: 2020-05-14
  Administered 2020-05-12 – 2020-05-13 (×2): 15 mg via ORAL

## 2020-05-12 MED ORDER — TRAMADOL 50 MG PO TAB
50 mg | ORAL | 0 refills | Status: DC | PRN
Start: 2020-05-12 — End: 2020-05-14
  Administered 2020-05-12: 14:00:00 50 mg via ORAL

## 2020-05-13 MED ORDER — POLYETHYLENE GLYCOL 3350 17 GRAM PO PWPK
1 | Freq: Two times a day (BID) | ORAL | 0 refills | Status: DC
Start: 2020-05-13 — End: 2020-05-14
  Administered 2020-05-13 – 2020-05-14 (×2): 17 g via ORAL

## 2020-05-13 MED ORDER — CARBIDOPA-LEVODOPA 25-100 MG PO TAB
2 | Freq: Four times a day (QID) | ORAL | 0 refills | Status: DC
Start: 2020-05-13 — End: 2020-05-14

## 2020-05-13 MED ORDER — SENNOSIDES-DOCUSATE SODIUM 8.6-50 MG PO TAB
2 | Freq: Two times a day (BID) | ORAL | 0 refills | Status: DC
Start: 2020-05-13 — End: 2020-05-14
  Administered 2020-05-13 – 2020-05-14 (×2): 2 via ORAL

## 2020-05-13 MED ORDER — POTASSIUM CHLORIDE 20 MEQ PO TBTQ
40 meq | Freq: Once | ORAL | 0 refills | Status: CP
Start: 2020-05-13 — End: ?
  Administered 2020-05-13: 11:00:00 40 meq via ORAL

## 2020-05-14 ENCOUNTER — Inpatient Hospital Stay: Admit: 2020-05-14 | Discharge: 2020-05-14

## 2020-05-14 MED ORDER — LACTATED RINGERS IV SOLP
500 mL | INTRAVENOUS | 0 refills | Status: CP
Start: 2020-05-14 — End: ?

## 2020-05-14 MED ORDER — IMS MIXTURE TEMPLATE
10 mg | Freq: Every day | 0 refills | Status: DC
Start: 2020-05-14 — End: 2020-05-16

## 2020-05-14 MED ORDER — PROPOFOL 10 MG/ML IV EMUL
5-70 ug/kg/min | INTRAVENOUS | 0 refills | Status: DC
Start: 2020-05-14 — End: 2020-05-14
  Administered 2020-05-14 (×2): 65 ug/kg/min via INTRAVENOUS

## 2020-05-14 MED ORDER — FUROSEMIDE 10 MG/ML IJ SOLN
80 mg | Freq: Once | INTRAVENOUS | 0 refills | Status: CP
Start: 2020-05-14 — End: ?
  Administered 2020-05-14: 23:00:00 80 mg via INTRAVENOUS

## 2020-05-14 MED ORDER — ROCURONIUM 10 MG/ML IV SOLN
100 mg | Freq: Once | INTRAVENOUS | 0 refills | Status: CP
Start: 2020-05-14 — End: ?
  Administered 2020-05-14: 16:00:00 100 mg via INTRAVENOUS

## 2020-05-14 MED ORDER — LEVOTHYROXINE 50 MCG PO TAB
50 ug | Freq: Every day | GASTROSTOMY | 0 refills | Status: DC
Start: 2020-05-14 — End: 2020-05-16
  Administered 2020-05-15: 11:00:00 50 ug via GASTROSTOMY

## 2020-05-14 MED ORDER — CARBIDOPA-LEVODOPA 25-100 MG PO TAB
2 | Freq: Four times a day (QID) | GASTROSTOMY | 0 refills | Status: DC
Start: 2020-05-14 — End: 2020-05-16
  Administered 2020-05-14 – 2020-05-15 (×6): 2 via GASTROSTOMY

## 2020-05-14 MED ORDER — SENNOSIDES 8.8 MG/5 ML PO SYRP
8.8 mg | Freq: Two times a day (BID) | GASTROSTOMY | 0 refills | Status: DC
Start: 2020-05-14 — End: 2020-05-16
  Administered 2020-05-15 (×2): 8.8 mg via GASTROSTOMY

## 2020-05-14 MED ORDER — POLYETHYLENE GLYCOL 3350 17 GRAM PO PWPK
1 | Freq: Two times a day (BID) | GASTROSTOMY | 0 refills | Status: DC
Start: 2020-05-14 — End: 2020-05-16
  Administered 2020-05-15 (×2): 17 g via GASTROSTOMY

## 2020-05-14 MED ORDER — CISATRACURIUM 200MG/100ML NS IV DRIP (STD CONC)
.5-10 ug/kg/min | INTRAVENOUS | 0 refills | Status: DC
Start: 2020-05-14 — End: 2020-05-15
  Administered 2020-05-14 (×2): 1 ug/kg/min via INTRAVENOUS
  Administered 2020-05-15 (×2): 1.75 ug/kg/min via INTRAVENOUS

## 2020-05-14 MED ORDER — FENTANYL DRIP IN NS 1000MCG/100ML
10-100 ug/h | INTRAVENOUS | 0 refills | Status: DC
Start: 2020-05-14 — End: 2020-05-16
  Administered 2020-05-15 (×2): 65 ug/h via INTRAVENOUS

## 2020-05-14 MED ORDER — IMS MIXTURE TEMPLATE
20 mg | Freq: Every day | 0 refills | Status: DC
Start: 2020-05-14 — End: 2020-05-16
  Administered 2020-05-14 – 2020-05-15 (×4): 20 mg

## 2020-05-14 MED ORDER — FAMOTIDINE 40 MG/5 ML (8 MG/ML) PO SUSP
20 mg | Freq: Two times a day (BID) | NASOGASTRIC | 0 refills | Status: DC
Start: 2020-05-14 — End: 2020-05-15
  Administered 2020-05-15 (×2): 20 mg via NASOGASTRIC

## 2020-05-14 MED ORDER — PROPOFOL 10 MG/ML IV EMUL
5-100 ug/kg/min | INTRAVENOUS | 0 refills | Status: DC
Start: 2020-05-14 — End: 2020-05-16
  Administered 2020-05-14: 23:00:00 85 ug/kg/min via INTRAVENOUS
  Administered 2020-05-14: 23:00:00 95 ug/kg/min via INTRAVENOUS
  Administered 2020-05-14: 21:00:00 100 ug/kg/min via INTRAVENOUS
  Administered 2020-05-15 (×7): 80 ug/kg/min via INTRAVENOUS
  Administered 2020-05-15: 02:00:00 95 ug/kg/min via INTRAVENOUS
  Administered 2020-05-15 – 2020-05-16 (×2): 80 ug/kg/min via INTRAVENOUS

## 2020-05-14 MED ORDER — PREDNISONE 5 MG/ML PO CONC
20 mg | Freq: Every day | OROGASTRIC | 0 refills | Status: DC
Start: 2020-05-14 — End: 2020-05-14

## 2020-05-14 MED ORDER — PANTOPRAZOLE 40 MG PO TBEC
40 mg | Freq: Every day | ORAL | 0 refills | Status: DC
Start: 2020-05-14 — End: 2020-05-14

## 2020-05-14 MED ORDER — ETOMIDATE 2 MG/ML IV SOLN
30 mg | Freq: Once | INTRAVENOUS | 0 refills | Status: CP
Start: 2020-05-14 — End: ?
  Administered 2020-05-14: 14:00:00 30 mg via INTRAVENOUS

## 2020-05-14 MED ORDER — PANTOPRAZOLE(#) 2MG/ML PO SUSP
40 mg | Freq: Every day | GASTROSTOMY | 0 refills | Status: DC
Start: 2020-05-14 — End: 2020-05-14
  Administered 2020-05-14: 17:00:00 40 mg via GASTROSTOMY

## 2020-05-14 MED ORDER — CHLORHEXIDINE GLUCONATE 0.12 % MM MWSH
15 mL | Freq: Two times a day (BID) | 0 refills | Status: DC
Start: 2020-05-14 — End: 2020-05-16
  Administered 2020-05-14 – 2020-05-15 (×3): 15 mL

## 2020-05-14 MED ORDER — NOREPINEPHRINE BITARTRATE-NACL 4 MG/250 ML (16 MCG/ML) IV SOLN
0-.5 ug/kg/min | INTRAVENOUS | 0 refills | Status: DC
Start: 2020-05-14 — End: 2020-05-16
  Administered 2020-05-14: 20:00:00 0.02 ug/kg/min via INTRAVENOUS
  Administered 2020-05-16: 0.15 ug/kg/min via INTRAVENOUS

## 2020-05-14 MED ORDER — PANCRELIPASE-SODIUM BICARBONATE 20,880 K UNIT-650 MG
NASOGASTRIC | 0 refills | Status: DC | PRN
Start: 2020-05-14 — End: 2020-05-15

## 2020-05-14 MED ORDER — ROCURONIUM 10 MG/ML IV SOLN
90 mg | Freq: Once | INTRAVENOUS | 0 refills | Status: CP
Start: 2020-05-14 — End: ?
  Administered 2020-05-14: 14:00:00 90 mg via INTRAVENOUS

## 2020-05-14 MED ORDER — ROCURONIUM 10 MG/ML IV SOLN
50 mg | Freq: Once | INTRAVENOUS | 0 refills | Status: CP
Start: 2020-05-14 — End: ?

## 2020-05-14 MED ORDER — DOCUSATE SODIUM 50 MG/5 ML PO LIQD
100 mg | Freq: Two times a day (BID) | GASTROSTOMY | 0 refills | Status: DC
Start: 2020-05-14 — End: 2020-05-16
  Administered 2020-05-15 (×2): 100 mg via GASTROSTOMY

## 2020-05-14 MED ORDER — PREDNISONE 5 MG/ML PO CONC
10 mg | Freq: Every day | OROGASTRIC | 0 refills | Status: DC
Start: 2020-05-14 — End: 2020-05-14

## 2020-05-14 MED ORDER — BISACODYL 10 MG RE SUPP
10 mg | Freq: Two times a day (BID) | RECTAL | 0 refills | Status: DC
Start: 2020-05-14 — End: 2020-05-15
  Administered 2020-05-14 – 2020-05-15 (×2): 10 mg via RECTAL

## 2020-05-14 MED ADMIN — LACTATED RINGERS IV SOLP [4318]: 500 mL | INTRAVENOUS | @ 20:00:00 | Stop: 2020-05-14 | NDC 00338011704

## 2020-05-14 MED ADMIN — PROPOFOL 10 MG/ML IV EMUL [11150]: 15 ug/kg/min | INTRAVENOUS | @ 14:00:00 | Stop: 2020-05-14 | NDC 00069024801

## 2020-05-14 MED ADMIN — FENTANYL DRIP IN NS 1000MCG/100ML [30807]: 25 ug/h | INTRAVENOUS | @ 14:00:00 | Stop: 2020-05-14 | NDC 70004020232

## 2020-05-14 MED ADMIN — ROCURONIUM 10 MG/ML IV SOLN [78847]: 50 mg | INTRAVENOUS | @ 21:00:00 | Stop: 2020-05-14 | NDC 00409955810

## 2020-05-15 ENCOUNTER — Inpatient Hospital Stay: Admit: 2020-05-15 | Discharge: 2020-05-15

## 2020-05-15 MED ORDER — BISACODYL 10 MG RE SUPP
10 mg | Freq: Every day | RECTAL | 0 refills | Status: DC
Start: 2020-05-15 — End: 2020-05-16
  Administered 2020-05-15: 14:00:00 10 mg via RECTAL

## 2020-05-15 MED ORDER — NOREPINEPHRINE 16 MCG/ML IN D5W IV DRIP (STD CONC)
0 refills | Status: CP
Start: 2020-05-15 — End: ?

## 2020-05-15 MED ORDER — AMIODARONE 50 MG/ML IV SOLN
0 refills | Status: CP
Start: 2020-05-15 — End: ?
  Administered 2020-05-15: 23:00:00 150 mg via INTRAVENOUS

## 2020-05-15 MED ORDER — MIDAZOLAM 1 MG/ML IJ SOLN
4 mg | Freq: Once | INTRAVENOUS | 0 refills | Status: CP
Start: 2020-05-15 — End: ?
  Administered 2020-05-15: 4 mg via INTRAVENOUS

## 2020-05-15 MED ORDER — MIDAZOLAM 1 MG/ML IJ SOLN
1 mg | INTRAVENOUS | 0 refills | Status: DC | PRN
Start: 2020-05-15 — End: 2020-05-16

## 2020-05-15 MED ORDER — INSULIN REGULAR HUMAN(#) 1 UNIT/ML IJ SYRINGE
10 [IU] | Freq: Once | INTRAVENOUS | 0 refills | Status: CP
Start: 2020-05-15 — End: ?
  Administered 2020-05-15: 12:00:00 10 [IU] via INTRAVENOUS

## 2020-05-15 MED ORDER — FAMOTIDINE 40 MG/5 ML (8 MG/ML) PO SUSP
20 mg | Freq: Every day | NASOGASTRIC | 0 refills | Status: DC
Start: 2020-05-15 — End: 2020-05-16

## 2020-05-15 MED ORDER — ARTIFICIAL TEARS SINGLE DOSE DROPS GROUP
1 [drp] | OPHTHALMIC | 0 refills | Status: DC | PRN
Start: 2020-05-15 — End: 2020-05-16
  Administered 2020-05-15: 22:00:00 1 [drp] via OPHTHALMIC

## 2020-05-15 MED ORDER — ARTIFICIAL SALIVA (YERBAS-LYT) MM SPRA
1 | OROMUCOSAL | 0 refills | Status: DC | PRN
Start: 2020-05-15 — End: 2020-05-16

## 2020-05-15 MED ORDER — LACTATED RINGERS IV SOLP
INTRAVENOUS | 0 refills | Status: CP
Start: 2020-05-15 — End: ?
  Administered 2020-05-15: 23:00:00 1000 mL via INTRAVENOUS

## 2020-05-15 MED ORDER — HYDROMORPHONE (PF) 2 MG/ML IJ SYRG
.2-.4 mg | INTRAVENOUS | 0 refills | Status: DC | PRN
Start: 2020-05-15 — End: 2020-05-16

## 2020-05-15 MED ORDER — DEXTROSE 50 % IN WATER (D50W) IV SOLP
25 g | Freq: Once | INTRAVENOUS | 0 refills | Status: CP
Start: 2020-05-15 — End: ?
  Administered 2020-05-15: 12:00:00 50 mL via INTRAVENOUS

## 2020-05-15 MED ORDER — MIDAZOLAM 1 MG/ML IJ SOLN
2 mg | Freq: Once | INTRAVENOUS | 0 refills | Status: DC
Start: 2020-05-15 — End: 2020-05-16

## 2020-05-15 MED ORDER — CISATRACURIUM 200MG/100ML NS IV DRIP (STD CONC)
.5-4 ug/kg/min | INTRAVENOUS | 0 refills | Status: DC
Start: 2020-05-15 — End: 2020-05-16
  Administered 2020-05-15 (×2): 1.75 ug/kg/min via INTRAVENOUS

## 2020-05-15 MED ORDER — SODIUM ZIRCONIUM CYCLOSILICATE 10 GRAM PO PWPK
10 g | GASTROSTOMY | 0 refills | Status: DC
Start: 2020-05-15 — End: 2020-05-16
  Administered 2020-05-15 (×2): 10 g via GASTROSTOMY

## 2020-05-15 MED ORDER — GLYCOPYRROLATE 0.2 MG/ML IJ SOLN
.4 mg | INTRAVENOUS | 0 refills | Status: DC | PRN
Start: 2020-05-15 — End: 2020-05-16

## 2020-05-15 MED ORDER — EPINEPHRINE 0.1 MG/ML IJ SYRG
0 refills | Status: CP
Start: 2020-05-15 — End: ?
  Administered 2020-05-15 (×2): 1 mg via INTRAVENOUS

## 2020-05-15 MED ORDER — LORAZEPAM 2 MG/ML IJ SOLN
1 mg | INTRAVENOUS | 0 refills | Status: DC | PRN
Start: 2020-05-15 — End: 2020-05-16
  Administered 2020-05-16: 03:00:00 1 mg via INTRAVENOUS

## 2020-05-15 MED ADMIN — MIDAZOLAM 1 MG/ML IJ SOLN [10607]: 2 mg | INTRAVENOUS | @ 23:00:00 | Stop: 2020-05-15 | NDC 00409230516

## 2020-05-16 ENCOUNTER — Encounter: Admit: 2020-05-16 | Discharge: 2020-05-16

## 2020-05-16 NOTE — Telephone Encounter
Recieved request for death certificate signature. Dr Truddie Crumble updated

## 2020-05-29 DEATH — deceased
